# Patient Record
Sex: Male | Born: 1960
Health system: Southern US, Community
[De-identification: ages and names within clinical notes are randomized; demographics above are authoritative.]

---

## 2000-01-19 ENCOUNTER — Other Ambulatory Visit: Admission: RE | Admit: 2000-01-19 | Discharge: 2000-01-19 | Payer: Self-pay | Admitting: Urology

## 2000-07-24 ENCOUNTER — Encounter: Payer: Self-pay | Admitting: Specialist

## 2000-07-24 ENCOUNTER — Ambulatory Visit (HOSPITAL_COMMUNITY): Admission: RE | Admit: 2000-07-24 | Discharge: 2000-07-24 | Payer: Self-pay | Admitting: Specialist

## 2000-08-14 ENCOUNTER — Ambulatory Visit (HOSPITAL_COMMUNITY): Admission: RE | Admit: 2000-08-14 | Discharge: 2000-08-14 | Payer: Self-pay | Admitting: Specialist

## 2000-08-14 ENCOUNTER — Encounter: Payer: Self-pay | Admitting: Specialist

## 2000-08-15 ENCOUNTER — Encounter: Payer: Self-pay | Admitting: Specialist

## 2000-08-15 ENCOUNTER — Ambulatory Visit (HOSPITAL_COMMUNITY): Admission: RE | Admit: 2000-08-15 | Discharge: 2000-08-15 | Payer: Self-pay | Admitting: Specialist

## 2007-10-06 ENCOUNTER — Encounter: Admission: RE | Admit: 2007-10-06 | Discharge: 2007-10-06 | Payer: Self-pay | Admitting: Specialist

## 2011-02-21 ENCOUNTER — Emergency Department (HOSPITAL_COMMUNITY)
Admission: EM | Admit: 2011-02-21 | Discharge: 2011-02-21 | Disposition: A | Discharge status: Home / Self Care | Payer: Self-pay | Attending: Emergency Medicine | Admitting: Emergency Medicine

## 2011-02-21 DIAGNOSIS — F1010 Alcohol abuse, uncomplicated: Secondary | ICD-10-CM | POA: Insufficient documentation

## 2011-02-21 DIAGNOSIS — X58XXXA Exposure to other specified factors, initial encounter: Secondary | ICD-10-CM | POA: Insufficient documentation

## 2011-02-21 DIAGNOSIS — IMO0002 Reserved for concepts with insufficient information to code with codable children: Secondary | ICD-10-CM | POA: Insufficient documentation

## 2011-02-21 DIAGNOSIS — I10 Essential (primary) hypertension: Secondary | ICD-10-CM | POA: Insufficient documentation

## 2011-02-21 DIAGNOSIS — Z79899 Other long term (current) drug therapy: Secondary | ICD-10-CM | POA: Insufficient documentation

## 2011-02-21 LAB — POCT I-STAT, CHEM 8
BUN: 15 mg/dL (ref 6–23)
Calcium, Ion: 1.04 mmol/L — ABNORMAL LOW (ref 1.12–1.32)
Chloride: 105 mEq/L (ref 96–112)
Creatinine, Ser: 1.5 mg/dL — ABNORMAL HIGH (ref 0.50–1.35)
Glucose, Bld: 119 mg/dL — ABNORMAL HIGH (ref 70–99)
HCT: 43 % (ref 39.0–52.0)
Hemoglobin: 14.6 g/dL (ref 13.0–17.0)
Potassium: 3.9 mEq/L (ref 3.5–5.1)
Sodium: 142 mEq/L (ref 135–145)
TCO2: 24 mmol/L (ref 0–100)

## 2011-02-21 LAB — ETHANOL: Alcohol, Ethyl (B): 305 mg/dL — ABNORMAL HIGH (ref 0–11)

## 2011-02-21 LAB — RAPID URINE DRUG SCREEN, HOSP PERFORMED
Amphetamines: NOT DETECTED
Barbiturates: NOT DETECTED
Benzodiazepines: NOT DETECTED
Cocaine: NOT DETECTED
Opiates: NOT DETECTED
Tetrahydrocannabinol: NOT DETECTED

## 2015-10-19 MED FILL — LISINOPRIL 10 MG TABLET: 10 | 90 days supply | Qty: 90 | Fill #1

## 2015-10-19 MED FILL — PAROXETINE CR 25 MG TABLET: 25 | 90 days supply | Qty: 90 | Fill #3

## 2015-12-12 DIAGNOSIS — Z79899 Other long term (current) drug therapy: Secondary | ICD-10-CM | POA: Diagnosis not present

## 2015-12-12 DIAGNOSIS — J301 Allergic rhinitis due to pollen: Secondary | ICD-10-CM | POA: Diagnosis not present

## 2015-12-12 DIAGNOSIS — I10 Essential (primary) hypertension: Secondary | ICD-10-CM | POA: Diagnosis not present

## 2015-12-12 DIAGNOSIS — F325 Major depressive disorder, single episode, in full remission: Secondary | ICD-10-CM | POA: Diagnosis not present

## 2015-12-12 DIAGNOSIS — Z0000 Encounter for general adult medical examination without abnormal findings: Secondary | ICD-10-CM | POA: Diagnosis not present

## 2015-12-12 DIAGNOSIS — R110 Nausea: Secondary | ICD-10-CM | POA: Diagnosis not present

## 2015-12-12 DIAGNOSIS — F900 Attention-deficit hyperactivity disorder, predominantly inattentive type: Secondary | ICD-10-CM | POA: Diagnosis not present

## 2015-12-12 DIAGNOSIS — Z6833 Body mass index (BMI) 33.0-33.9, adult: Secondary | ICD-10-CM | POA: Diagnosis not present

## 2015-12-12 DIAGNOSIS — E669 Obesity, unspecified: Secondary | ICD-10-CM | POA: Diagnosis not present

## 2016-01-27 MED FILL — LISINOPRIL 10 MG TABLET: 10 | 90 days supply | Qty: 90 | Fill #2

## 2016-04-11 DIAGNOSIS — Z79899 Other long term (current) drug therapy: Secondary | ICD-10-CM | POA: Diagnosis not present

## 2016-04-11 DIAGNOSIS — Z6832 Body mass index (BMI) 32.0-32.9, adult: Secondary | ICD-10-CM | POA: Diagnosis not present

## 2016-04-11 DIAGNOSIS — K648 Other hemorrhoids: Secondary | ICD-10-CM | POA: Diagnosis not present

## 2016-04-11 DIAGNOSIS — E669 Obesity, unspecified: Secondary | ICD-10-CM | POA: Diagnosis not present

## 2016-04-11 DIAGNOSIS — I10 Essential (primary) hypertension: Secondary | ICD-10-CM | POA: Diagnosis not present

## 2016-04-11 DIAGNOSIS — F325 Major depressive disorder, single episode, in full remission: Secondary | ICD-10-CM | POA: Diagnosis not present

## 2016-05-11 MED FILL — LISINOPRIL 10 MG TABLET: 10 | 90 days supply | Qty: 90 | Fill #3

## 2016-08-17 DIAGNOSIS — H524 Presbyopia: Secondary | ICD-10-CM | POA: Diagnosis not present

## 2016-08-30 MED FILL — LISINOPRIL 10 MG TABLET: 10 | 90 days supply | Qty: 90 | Fill #0

## 2016-10-09 DIAGNOSIS — G8929 Other chronic pain: Secondary | ICD-10-CM | POA: Diagnosis not present

## 2016-10-09 DIAGNOSIS — I10 Essential (primary) hypertension: Secondary | ICD-10-CM | POA: Diagnosis not present

## 2016-10-09 DIAGNOSIS — M25511 Pain in right shoulder: Secondary | ICD-10-CM | POA: Diagnosis not present

## 2016-10-09 DIAGNOSIS — M25551 Pain in right hip: Secondary | ICD-10-CM | POA: Diagnosis not present

## 2016-10-09 DIAGNOSIS — Z79899 Other long term (current) drug therapy: Secondary | ICD-10-CM | POA: Diagnosis not present

## 2016-11-27 ENCOUNTER — Ambulatory Visit: Payer: Self-pay | Admitting: Sports Medicine

## 2016-12-10 MED FILL — LISINOPRIL 10 MG TABLET: 10 | 90 days supply | Qty: 90 | Fill #1

## 2017-01-01 ENCOUNTER — Ambulatory Visit (INDEPENDENT_AMBULATORY_CARE_PROVIDER_SITE_OTHER): Payer: 59 | Admitting: Sports Medicine

## 2017-01-01 ENCOUNTER — Ambulatory Visit: Payer: Self-pay

## 2017-01-01 VITALS — BP 164/92 | Ht 71.0 in | Wt 235.0 lb

## 2017-01-01 DIAGNOSIS — G8929 Other chronic pain: Secondary | ICD-10-CM

## 2017-01-01 DIAGNOSIS — M25511 Pain in right shoulder: Principal | ICD-10-CM

## 2017-01-01 DIAGNOSIS — M25551 Pain in right hip: Secondary | ICD-10-CM

## 2017-01-01 DIAGNOSIS — M25512 Pain in left shoulder: Secondary | ICD-10-CM

## 2017-01-01 MED ORDER — METHYLPREDNISOLONE ACETATE 40 MG/ML IJ SUSP
40.0000 mg | Freq: Once | INTRAMUSCULAR | Status: AC
  Administered 2017-01-01: 40 mg via INTRA_ARTICULAR

## 2017-01-01 NOTE — Assessment & Plan Note (Signed)
Possible for greater trochanteric bursitis versus gluteal medius syndrome. He has a leg length discrepancy so try biomechanical adjustments and see how he does with that. - A three-quarter length heel lift was placed in his left shoe - He may benefit from custom orthotics in the future. - Could consider a greater trochanteric injection in the future.

## 2017-01-01 NOTE — Assessment & Plan Note (Signed)
He had some bursal thickening in the right subacromial space but otherwise was strong through tender cuff testing and did not significantly have any tearing upon ultrasound scanning. He does have some degenerative changes of the acromioclavicular joint as well. - Subacromial injection today. - Provided with home exercises - If he follows up can consider nitroglycerin therapy

## 2017-01-01 NOTE — Progress Notes (Signed)
Christian Smith - 56 y.o. male MRN [id number]  Date of birth: 10-04-1960  SUBJECTIVE:  Including CC & ROS.   [name] is a 56 year old male is presenting with right hip pain and right shoulder pain. He reports that his right shoulder pain is lateral in nature. He reports the pain is acute on chronic has been worse over the last month. He has a history of acromioclavicular sprain as well as a fractured scapula of the same shoulder. He has taken over-the-counter NSAIDs for the pain. He describes pain as a dull aching usually after activity. The pain is proximal in nature. He has not had any injection therapy or physical therapy at this point.  Right hip pain is lateral in nature. The pain is worse after walking. It is relieved if he is able to sit down. He denies any radicular symptoms. He reports that he has a history of a leg length discrepancy but does not wear any corrective insoles or shoes.  ROS: No unexpected weight loss, fever, chills, swelling, instability,  numbness/tingling, redness, otherwise see HPI   HISTORY: Past Medical, Surgical, Social, and Family History Reviewed & Updated per EMR.   Pertinent Historical Findings include: PMHx -hypertension, T11-12 compression fracture Surgical: Bilateral bunionectomy, hemorrhoidectomy   PSHx -former smoker, former smokeless tobacco use   FHx -  diabetes, hypertension, CHF   DATA REVIEWED: None  PHYSICAL EXAM:  VS: BP (!) 164/92   Ht 5\' 11"  (1.803 m)   Wt 235 lb (106.6 kg)   BMI 32.78 kg/m  PHYSICAL EXAM: Gen: NAD, alert, cooperative with exam, well-appearing HEENT: clear conjunctiva, EOMI CV:  no edema, capillary refill brisk,  Resp: non-labored, normal speech Skin: no rashes, normal turgor  Neuro: no gross deficits.  Psych:  alert and oriented MSK:  Right Shoulder: His acromioclavicular joint as hypertrophy of the distal clavicle from prior before meals strain. Palpation is normal with no tenderness over AC joint ROM is  full in all planes. Some pain with empty can testing. Some pain with crossarm testing. Speeds and Yergason's tests normal. Right hip: No significant areas of tenderness over the lumbar spine, paraspinal muscles, piriformis, greater trochanter, or SI joint. Normal internal and external rotation of the hip bilaterally. Normal strength hip flexion. Normal knee flexion and extension. Some pain with FADIR  Normal FABER  Leg lengths from ASIS  Left: 92 cm  Right: 94.5 cm  Neurovascularly intact  Ultrasound: Right shoulder:  The biceps and was viewed in short axis and appears to be normal. The subscapularis and infraspinatus were normal in appearance. The supraspinatus had some bursal thickening and a mild hypoechoic changes at the endplate but does not appear to be a tear. He does have some calcific changes within the footplate of the supraspinatus. No chronic clavicular joint has some hypertrophy as well as tenderness changes in the acromioclavicular joint.   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  Ultrasound and interpretation by Clearance Coots, MD   Aspiration/Injection Procedure Note Christian Smith 02/21/1961  Procedure: Injection Indications: Right shoulder pain/subacromial bursitis  Procedure Details Consent: Risks of procedure as well as the alternatives and risks of each were explained to the (patient/caregiver).  Consent for procedure obtained. Time Out: Verified patient identification, verified procedure, site/side was marked, verified correct patient position, special equipment/implants available, medications/allergies/relevent history reviewed, required imaging and test results available.  Performed.  The area was cleaned with iodine and alcohol swabs.  The right subacromial space was injected using 1 cc's of 40 mg Depomedrol and 4 cc's of 1% lidocaine with a 22 1  1/2" needle.    A sterile dressing was applied.  Patient did tolerate procedure well.   ASSESSMENT & PLAN:   Chronic right shoulder pain He had some bursal thickening in the right subacromial space but otherwise was strong through tender cuff testing and did not significantly have any tearing upon ultrasound scanning. He does have some degenerative changes of the acromioclavicular joint as well. - Subacromial injection today. - Provided with home exercises - If he follows up can consider nitroglycerin therapy  Right hip pain Possible for greater trochanteric bursitis versus gluteal medius syndrome. He has a leg length discrepancy so try biomechanical adjustments and see how he does with that. - A three-quarter length heel lift was placed in his left shoe - He may benefit from custom orthotics in the future. - Could consider a greater trochanteric injection in the future.

## 2017-01-04 DIAGNOSIS — R351 Nocturia: Secondary | ICD-10-CM | POA: Diagnosis not present

## 2017-01-04 DIAGNOSIS — F325 Major depressive disorder, single episode, in full remission: Secondary | ICD-10-CM | POA: Diagnosis not present

## 2017-01-04 DIAGNOSIS — Z79899 Other long term (current) drug therapy: Secondary | ICD-10-CM | POA: Diagnosis not present

## 2017-01-04 DIAGNOSIS — Z125 Encounter for screening for malignant neoplasm of prostate: Secondary | ICD-10-CM | POA: Diagnosis not present

## 2017-01-04 DIAGNOSIS — N401 Enlarged prostate with lower urinary tract symptoms: Secondary | ICD-10-CM | POA: Diagnosis not present

## 2017-01-04 DIAGNOSIS — E669 Obesity, unspecified: Secondary | ICD-10-CM | POA: Diagnosis not present

## 2017-01-04 DIAGNOSIS — I10 Essential (primary) hypertension: Secondary | ICD-10-CM | POA: Diagnosis not present

## 2017-01-04 DIAGNOSIS — Z0000 Encounter for general adult medical examination without abnormal findings: Secondary | ICD-10-CM | POA: Diagnosis not present

## 2017-01-04 DIAGNOSIS — R197 Diarrhea, unspecified: Secondary | ICD-10-CM | POA: Diagnosis not present

## 2017-01-04 MED FILL — AMLODIPINE BESYLATE 2.5 MG: 2.5 | 30 days supply | Qty: 30 | Fill #0

## 2017-02-05 MED FILL — AMLODIPINE BESYLATE 2.5 MG: 2.5 | 30 days supply | Qty: 30 | Fill #1

## 2017-03-11 DIAGNOSIS — I10 Essential (primary) hypertension: Secondary | ICD-10-CM | POA: Diagnosis not present

## 2017-03-11 MED FILL — AMLODIPINE BESYLATE 2.5 MG: 2.5 | 30 days supply | Qty: 30 | Fill #2

## 2017-03-27 MED FILL — LISINOPRIL 10 MG TABLET: 10 | 90 days supply | Qty: 90 | Fill #2

## 2017-04-16 MED FILL — AMLODIPINE BESYLATE 2.5 MG: 2.5 | 30 days supply | Qty: 30 | Fill #3

## 2017-05-17 MED FILL — AMLODIPINE BESYLATE 2.5 MG: 2.5 | 30 days supply | Qty: 30 | Fill #4

## 2017-06-20 MED FILL — AMLODIPINE BESYLATE 2.5 MG: 2.5 | 30 days supply | Qty: 30 | Fill #5

## 2017-06-21 DIAGNOSIS — M25572 Pain in left ankle and joints of left foot: Secondary | ICD-10-CM | POA: Diagnosis not present

## 2017-06-21 DIAGNOSIS — M76822 Posterior tibial tendinitis, left leg: Secondary | ICD-10-CM | POA: Diagnosis not present

## 2017-06-27 MED FILL — LISINOPRIL 10 MG TABS: 10 | 90 days supply | Qty: 90 | Fill #3

## 2017-07-08 DIAGNOSIS — K591 Functional diarrhea: Secondary | ICD-10-CM | POA: Diagnosis not present

## 2017-07-08 DIAGNOSIS — R198 Other specified symptoms and signs involving the digestive system and abdomen: Secondary | ICD-10-CM | POA: Diagnosis not present

## 2017-07-08 DIAGNOSIS — M76822 Posterior tibial tendinitis, left leg: Secondary | ICD-10-CM | POA: Diagnosis not present

## 2017-07-15 DIAGNOSIS — Z23 Encounter for immunization: Secondary | ICD-10-CM | POA: Diagnosis not present

## 2017-07-15 DIAGNOSIS — I10 Essential (primary) hypertension: Secondary | ICD-10-CM | POA: Diagnosis not present

## 2017-07-16 MED FILL — AMLODIPINE BESYLATE 2.5 MG: 2.5 | 90 days supply | Qty: 90 | Fill #0

## 2017-08-02 DIAGNOSIS — M76822 Posterior tibial tendinitis, left leg: Secondary | ICD-10-CM | POA: Diagnosis not present

## 2017-08-02 MED FILL — DICLOFENAC SODIUM 1% GEL: 1 | 30 days supply | Qty: 500 | Fill #0

## 2017-10-02 DIAGNOSIS — H524 Presbyopia: Secondary | ICD-10-CM | POA: Diagnosis not present

## 2017-10-10 MED FILL — AMLODIPINE BESYLATE 2.5 MG: 2.5 | 90 days supply | Qty: 90 | Fill #1

## 2017-10-10 MED FILL — LISINOPRIL 10 MG TABS: 10 | 90 days supply | Qty: 90 | Fill #0

## 2018-01-07 ENCOUNTER — Other Ambulatory Visit: Payer: Self-pay | Admitting: Geriatric Medicine

## 2018-01-07 DIAGNOSIS — F325 Major depressive disorder, single episode, in full remission: Secondary | ICD-10-CM | POA: Diagnosis not present

## 2018-01-07 DIAGNOSIS — I10 Essential (primary) hypertension: Secondary | ICD-10-CM | POA: Diagnosis not present

## 2018-01-07 DIAGNOSIS — Z87891 Personal history of nicotine dependence: Secondary | ICD-10-CM

## 2018-01-07 DIAGNOSIS — F900 Attention-deficit hyperactivity disorder, predominantly inattentive type: Secondary | ICD-10-CM | POA: Diagnosis not present

## 2018-01-07 DIAGNOSIS — Z79899 Other long term (current) drug therapy: Secondary | ICD-10-CM | POA: Diagnosis not present

## 2018-01-07 DIAGNOSIS — E669 Obesity, unspecified: Secondary | ICD-10-CM | POA: Diagnosis not present

## 2018-01-07 DIAGNOSIS — Z0000 Encounter for general adult medical examination without abnormal findings: Secondary | ICD-10-CM | POA: Diagnosis not present

## 2018-01-07 DIAGNOSIS — Z6831 Body mass index (BMI) 31.0-31.9, adult: Secondary | ICD-10-CM | POA: Diagnosis not present

## 2018-01-13 ENCOUNTER — Ambulatory Visit
Admission: RE | Admit: 2018-01-13 | Discharge: 2018-01-13 | Disposition: A | Discharge status: Home / Self Care | Payer: 59 | Source: Ambulatory Visit | Attending: Geriatric Medicine | Admitting: Geriatric Medicine

## 2018-01-13 DIAGNOSIS — Z87891 Personal history of nicotine dependence: Secondary | ICD-10-CM

## 2018-01-13 DIAGNOSIS — I714 Abdominal aortic aneurysm, without rupture: Secondary | ICD-10-CM | POA: Diagnosis not present

## 2018-01-15 MED FILL — AMLODIPINE BESYLATE 2.5 MG: 2.5 | 90 days supply | Qty: 90 | Fill #2

## 2018-01-15 MED FILL — LISINOPRIL 10 MG TABLET: 10 | 90 days supply | Qty: 90 | Fill #1

## 2018-04-21 MED FILL — LISINOPRIL 10 MG TABLET: 10 | 90 days supply | Qty: 90 | Fill #2

## 2018-04-21 MED FILL — AMLODIPINE 2.5 MG TABLET: 2.5 | 90 days supply | Qty: 90 | Fill #0

## 2018-06-30 DIAGNOSIS — M25511 Pain in right shoulder: Secondary | ICD-10-CM | POA: Diagnosis not present

## 2018-06-30 DIAGNOSIS — I10 Essential (primary) hypertension: Secondary | ICD-10-CM | POA: Diagnosis not present

## 2018-06-30 DIAGNOSIS — Z79899 Other long term (current) drug therapy: Secondary | ICD-10-CM | POA: Diagnosis not present

## 2018-06-30 DIAGNOSIS — L989 Disorder of the skin and subcutaneous tissue, unspecified: Secondary | ICD-10-CM | POA: Diagnosis not present

## 2018-06-30 DIAGNOSIS — E669 Obesity, unspecified: Secondary | ICD-10-CM | POA: Diagnosis not present

## 2018-06-30 DIAGNOSIS — Z23 Encounter for immunization: Secondary | ICD-10-CM | POA: Diagnosis not present

## 2018-06-30 MED FILL — AMLODIPINE BESYLATE 5 MG TA: 5 | 30 days supply | Qty: 30 | Fill #0

## 2018-07-17 MED FILL — LISINOPRIL 10 MG TABS: 10 | 90 days supply | Qty: 90 | Fill #3

## 2018-08-11 MED FILL — AMLODIPINE BESYLATE 5 MG TA: 5 | 30 days supply | Qty: 30 | Fill #1

## 2018-09-11 MED FILL — AMLODIPINE BESYLATE 5 MG TA: 5 | 30 days supply | Qty: 30 | Fill #2

## 2018-10-09 MED FILL — AMLODIPINE BESYLATE 5 MG TA: 5 | 30 days supply | Qty: 30 | Fill #3

## 2018-10-24 DIAGNOSIS — H524 Presbyopia: Secondary | ICD-10-CM | POA: Diagnosis not present

## 2018-10-24 MED FILL — LISINOPRIL 10 MG TABS: 10 | 90 days supply | Qty: 90 | Fill #0

## 2018-10-30 ENCOUNTER — Ambulatory Visit: Payer: 59 | Admitting: Sports Medicine

## 2018-10-30 ENCOUNTER — Encounter: Payer: Self-pay | Admitting: Sports Medicine

## 2018-10-30 VITALS — BP 124/64 | Ht 71.0 in | Wt 244.0 lb

## 2018-10-30 DIAGNOSIS — M25512 Pain in left shoulder: Secondary | ICD-10-CM | POA: Diagnosis not present

## 2018-10-30 DIAGNOSIS — M25511 Pain in right shoulder: Secondary | ICD-10-CM

## 2018-10-30 NOTE — Assessment & Plan Note (Signed)
WE will assess status of XRays bilaterally  Keep up ROM  PRN aleve  Careful with overhead lifting

## 2018-10-30 NOTE — Progress Notes (Addendum)
  Christian Smith - 58 y.o. male MRN [id number]  Date of birth: 19-Jan-1961 Chief Complaint: Bilateral shoulder pain  HPI:  58 year old male who presents for bilateral shoulder pain (R>L).   Right shoulder Patient seen in 12/2016 which revealed bursal thickening and right subacromial space.  Also had findings consistent with AC joint arthritis.  Patient received cortisone injection which did help his pain for a few months.  He states that the pain is slowly coming back, and feels very similar to how it did before hand in 2018.  He has tried naproxen and topical Voltaren gel which have helped the symptoms some.  For his job he frequently has to lift 30 pounds up and down because this might be the etiology is of his pain.  Left shoulder States that he started having left shoulder pain early after the visit and 12/2016.  The left arm is worse than the right.  Causes significant pain at night and with activity.  States that he has trouble lifting things off high shelves due to the pain.  Treatment and response similar to right arm.  Past Hx Was in [place] Has done weight lifting and physical work   ROS:     See HPI  PERTINENT  PMH / [place] FH / / SH:  Past Medical, Surgical, Social, and Family History Reviewed & Updated in the EMR.  Pertinent findings include:  Right hip pain  OBJECTIVE: BP 124/64   Ht 5\' 11"  (1.803 m)   Wt 244 lb (110.7 kg)   BMI 34.03 kg/m   Physical Exam:  Vital signs are reviewed.  GEN: Alert and oriented, NAD Pulm: Breathing unlabored PSY: normal mood, congruent affect  MSK: Right Shoulder Inspection: no asymmetry noted Palpation: Mild tenderness to deep palpation medial aspect right shoulder Range of motion: full range of motion intact to shoulder extension, flexion, internal rotation, external rotation, adduction, abduction Strength: 5/5 strength to shoulder extensors, flexors. 5/5 strength to internal and external rotation. Neurovascular: no focal deficits. Cap  refill < 2 seconds. No numbness or tingling in arms Special tests: negative neer's, hawkins, empty can  Left Shoulder Inspection: no asymmetry noted Palpation: moderate tenderness to deep palpation medial/posterior aspect right shoulder Range of motion: limited range of motion to shoulder flexion, extension to 140 degrees. Limited range of motion to shoulder adduction, abduction - limits seem to be 2/2 pain Strength: 5/5 strength to shoulder extensors, flexors. 5/5 strength to internal and external rotation. Neurovascular: no focal deficits. Cap refill < 2 seconds. No numbness or tingling in arms Special tests: negative neer's, hawkins, empty can  ASSESSMENT & PLAN:  1. Bilateral Shoulder pain Patient without obvious signs of rotator cuff pathology. Likely further progression of osteoarthritis. Will get bilateral shoulder xrays to further evaluate. In the meantime can continue with anti-inflammatory medications. If negative for OA will have patient return to clinic for formal ultrasound to evaluate cuff and shoulder joints.  Keep up Rom exercises  Guadalupe Dawn MD PGY-2 Family Medicine Resident  I observed and examined the patient with the resident and agree with assessment and plan.  Note reviewed and modified by me. Ila Mcgill, MD  I reviewed XRs and called the patient He has mild OA of AC joints bilaterally If symptoms persist we should bring back for [place] of shoulders.  Left message for patient.  Ila Mcgill, MD 11/21/18

## 2018-11-05 ENCOUNTER — Ambulatory Visit
Admission: RE | Admit: 2018-11-05 | Discharge: 2018-11-05 | Disposition: A | Discharge status: Home / Self Care | Payer: 59 | Source: Ambulatory Visit | Attending: Sports Medicine | Admitting: Sports Medicine

## 2018-11-05 ENCOUNTER — Other Ambulatory Visit: Payer: Self-pay

## 2018-11-05 DIAGNOSIS — M25511 Pain in right shoulder: Secondary | ICD-10-CM

## 2018-11-05 DIAGNOSIS — M19011 Primary osteoarthritis, right shoulder: Secondary | ICD-10-CM | POA: Diagnosis not present

## 2018-11-05 DIAGNOSIS — M19012 Primary osteoarthritis, left shoulder: Secondary | ICD-10-CM | POA: Diagnosis not present

## 2018-11-05 DIAGNOSIS — M25512 Pain in left shoulder: Principal | ICD-10-CM

## 2018-11-05 MED FILL — AMLODIPINE BESYLATE 5 MG TA: 5 | 30 days supply | Qty: 30 | Fill #4 | Status: TO

## 2018-12-08 MED FILL — AMLODIPINE BESYLATE 5 MG TA: 5 | 90 days supply | Qty: 90 | Fill #0

## 2018-12-17 DIAGNOSIS — C44222 Squamous cell carcinoma of skin of right ear and external auricular canal: Secondary | ICD-10-CM | POA: Diagnosis not present

## 2018-12-18 DIAGNOSIS — C44222 Squamous cell carcinoma of skin of right ear and external auricular canal: Secondary | ICD-10-CM | POA: Diagnosis not present

## 2019-01-20 MED FILL — LISINOPRIL 10 MG TABS: 10 | 90 days supply | Qty: 90 | Fill #1

## 2019-03-16 MED FILL — AMLODIPINE BESYLATE 5 MG TA: 5 | 60 days supply | Qty: 60 | Fill #1

## 2019-04-29 DIAGNOSIS — D2371 Other benign neoplasm of skin of right lower limb, including hip: Secondary | ICD-10-CM | POA: Diagnosis not present

## 2019-04-29 DIAGNOSIS — L814 Other melanin hyperpigmentation: Secondary | ICD-10-CM | POA: Diagnosis not present

## 2019-04-29 DIAGNOSIS — Z85828 Personal history of other malignant neoplasm of skin: Secondary | ICD-10-CM | POA: Diagnosis not present

## 2019-04-29 DIAGNOSIS — L570 Actinic keratosis: Secondary | ICD-10-CM | POA: Diagnosis not present

## 2019-04-29 MED FILL — FLUOROURACIL 5% CREAM: 5 | 14 days supply | Qty: 40 | Fill #0

## 2019-05-07 MED FILL — LISINOPRIL 10 MG TABS: 10 | 90 days supply | Qty: 90 | Fill #2

## 2019-05-18 MED FILL — FLUOROURACIL 5% CREAM: 5 | 14 days supply | Qty: 40 | Fill #0

## 2019-05-20 MED FILL — AMLODIPINE BESYLATE 5 MG TA: 5 | 30 days supply | Qty: 30 | Fill #0

## 2019-06-24 MED FILL — AMLODIPINE BESYLATE 5 MG TA: 5 | 30 days supply | Qty: 30 | Fill #1

## 2019-07-01 DIAGNOSIS — I10 Essential (primary) hypertension: Secondary | ICD-10-CM | POA: Diagnosis not present

## 2019-07-01 DIAGNOSIS — Z79899 Other long term (current) drug therapy: Secondary | ICD-10-CM | POA: Diagnosis not present

## 2019-07-01 DIAGNOSIS — Z125 Encounter for screening for malignant neoplasm of prostate: Secondary | ICD-10-CM | POA: Diagnosis not present

## 2019-07-01 DIAGNOSIS — Z23 Encounter for immunization: Secondary | ICD-10-CM | POA: Diagnosis not present

## 2019-07-01 DIAGNOSIS — Z6835 Body mass index (BMI) 35.0-35.9, adult: Secondary | ICD-10-CM | POA: Diagnosis not present

## 2019-07-01 DIAGNOSIS — Z0000 Encounter for general adult medical examination without abnormal findings: Secondary | ICD-10-CM | POA: Diagnosis not present

## 2019-07-30 MED FILL — AMLODIPINE BESYLATE 5 MG TA: 5 | 30 days supply | Qty: 30 | Fill #2

## 2019-08-31 MED FILL — AMLODIPINE BESYLATE 5 MG TA: 5 | 30 days supply | Qty: 30 | Fill #3

## 2019-09-14 MED FILL — LISINOPRIL 10 MG TABS: 10 | 90 days supply | Qty: 90 | Fill #0

## 2019-10-06 MED FILL — AMLODIPINE BESYLATE 5 MG TA: 5 | 30 days supply | Qty: 30 | Fill #4

## 2019-11-05 MED FILL — AMLODIPINE BESYLATE 5 MG TA: 5 | 30 days supply | Qty: 30 | Fill #5

## 2019-11-25 MED FILL — AMLODIPINE BESYLATE 5 MG TA: 5 | 30 days supply | Qty: 30 | Fill #6

## 2019-12-24 MED FILL — LISINOPRIL 10 MG TABS: 10 | 90 days supply | Qty: 90 | Fill #1

## 2020-01-15 MED FILL — AMLODIPINE BESYLATE 5 MG TA: 5 | 30 days supply | Qty: 30 | Fill #7

## 2020-01-26 IMAGING — DX RIGHT SHOULDER - 2+ VIEW
3 series · 3 of 3 positions shown · non-contrast
Comparison: None.

CLINICAL DATA: Bilateral shoulder pain over the last year. Lifting
injury 1 year ago.

EXAM:
RIGHT SHOULDER - 2+ VIEW

[dg shoulder right (1 of 3)]
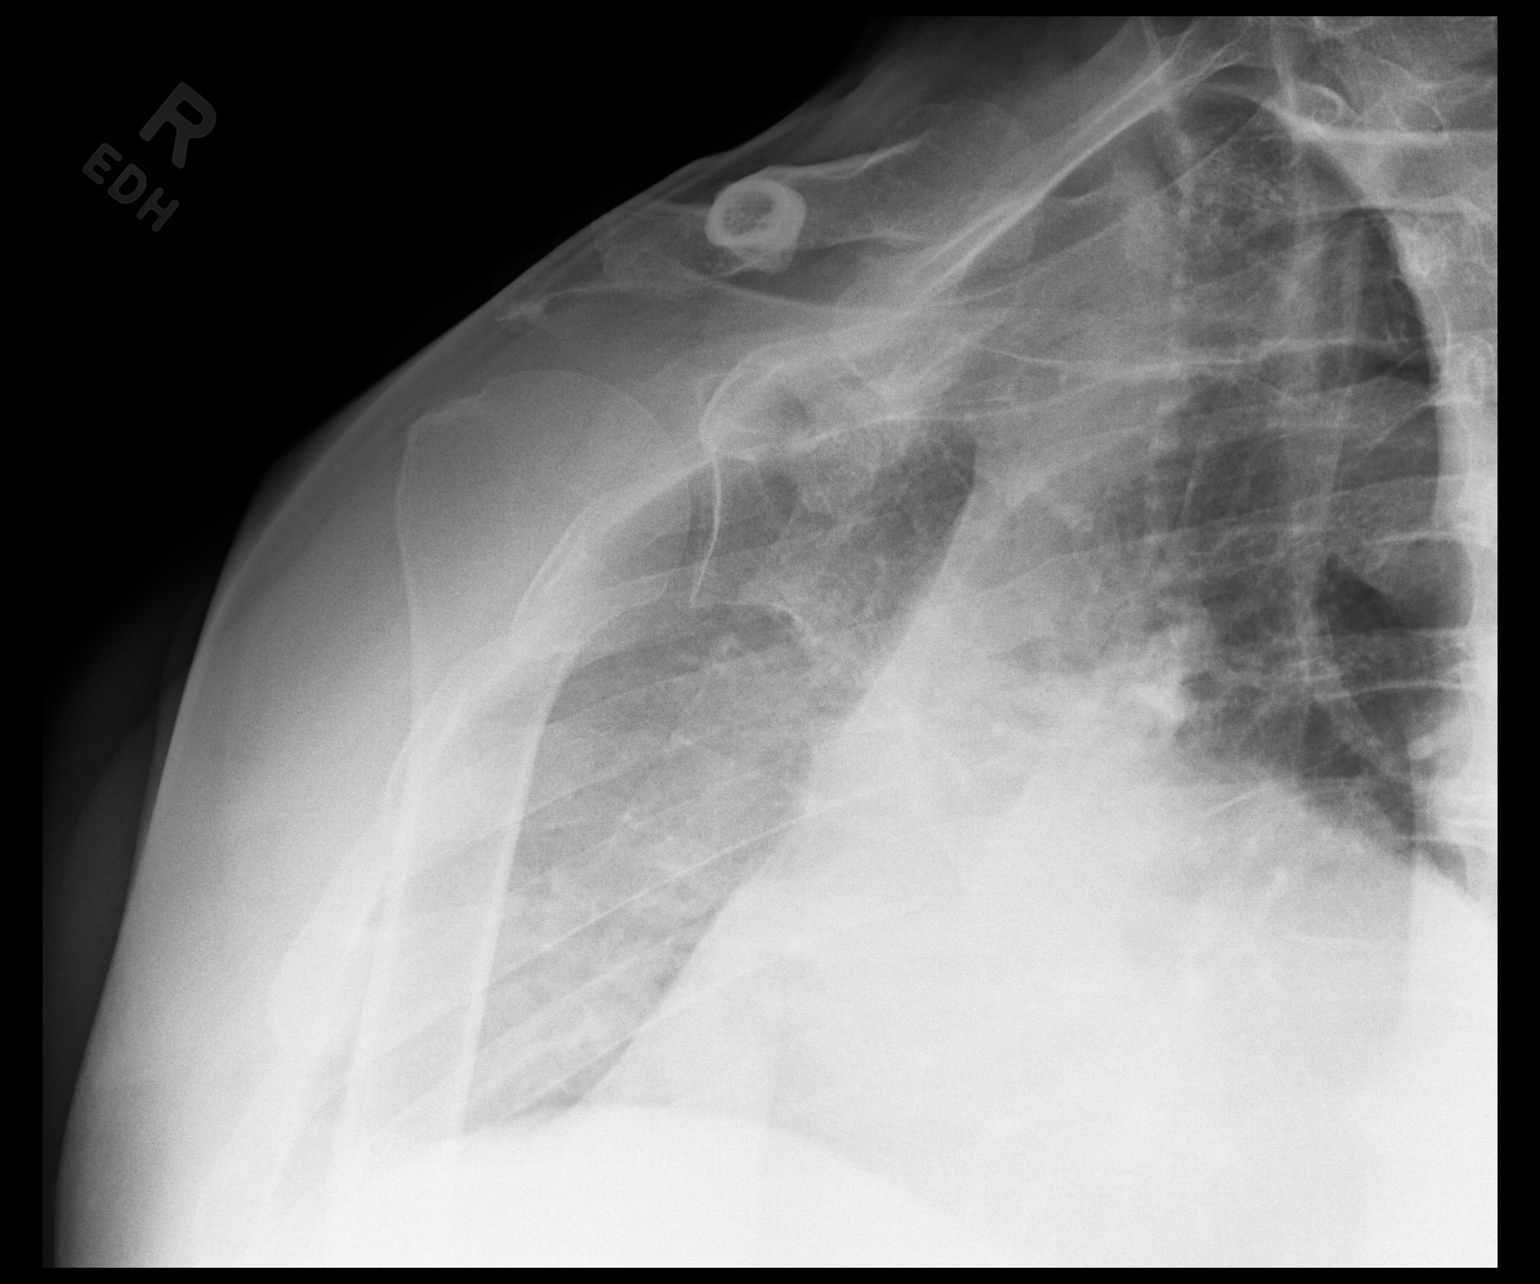

[dg shoulder right (2 of 3)]
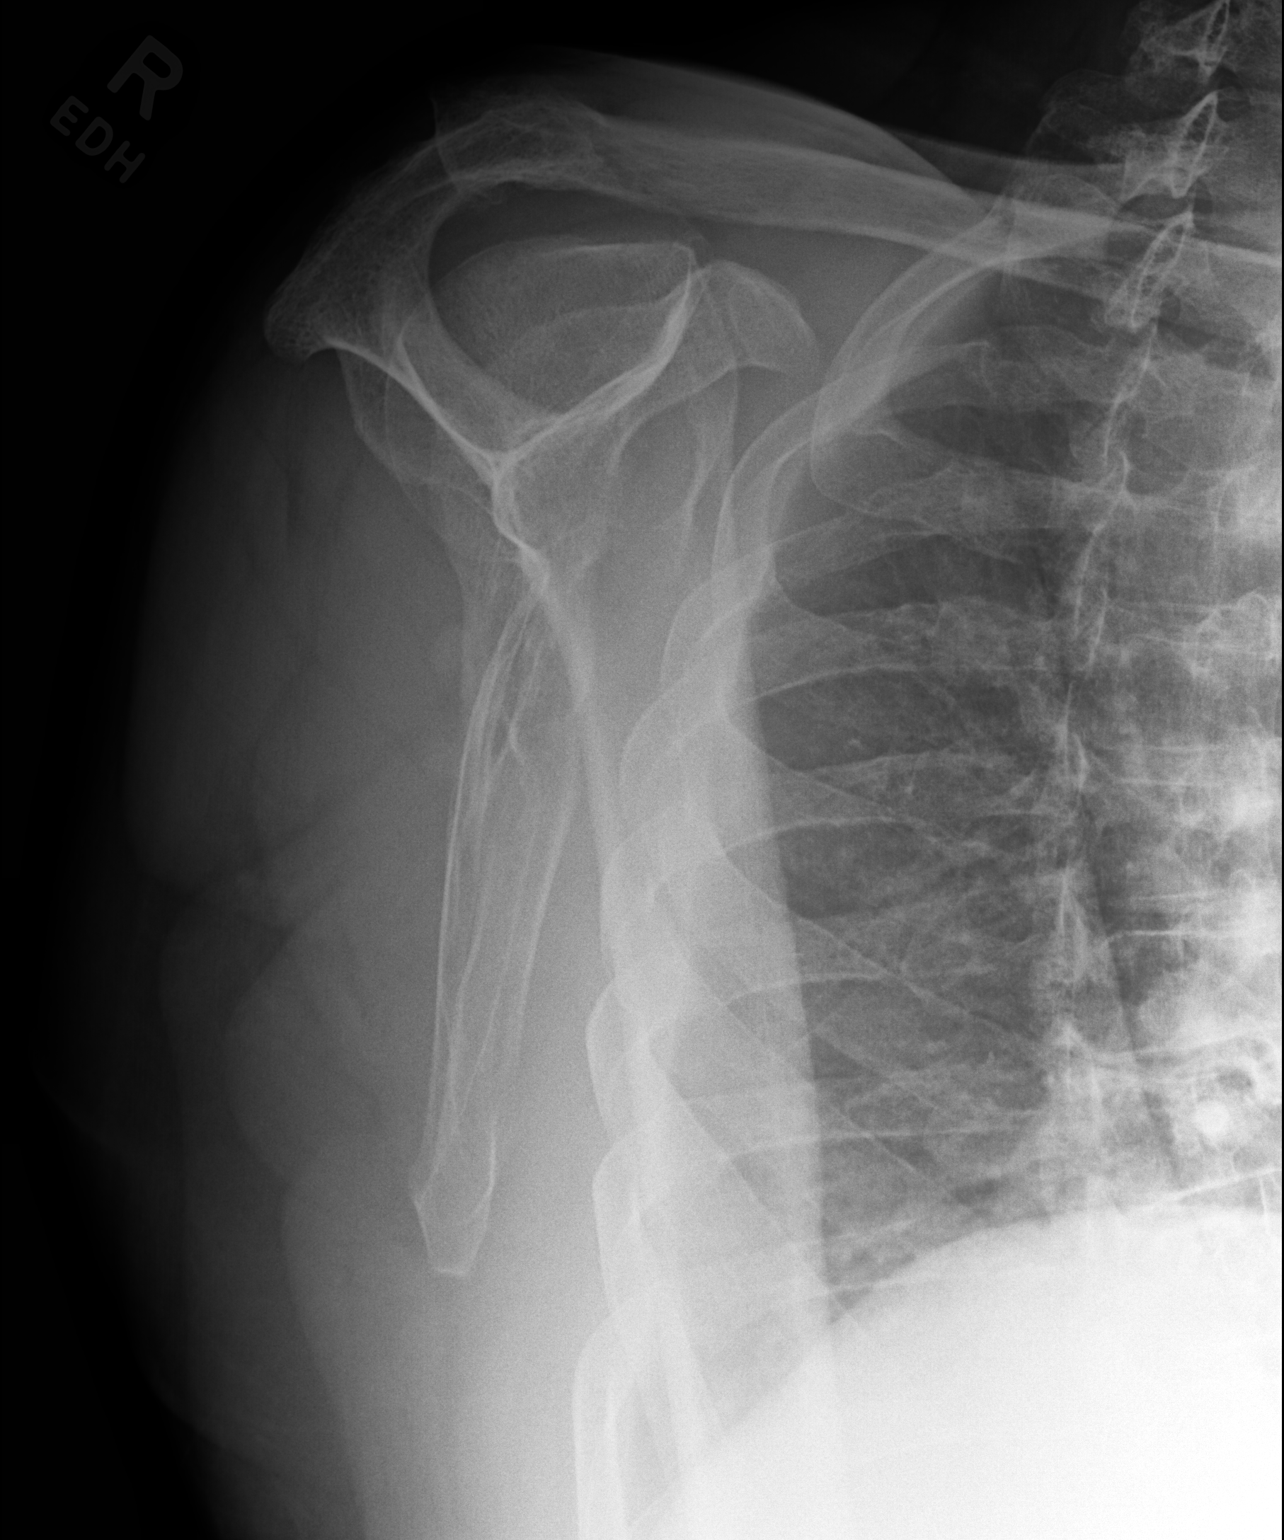

[dg shoulder right (3 of 3)]
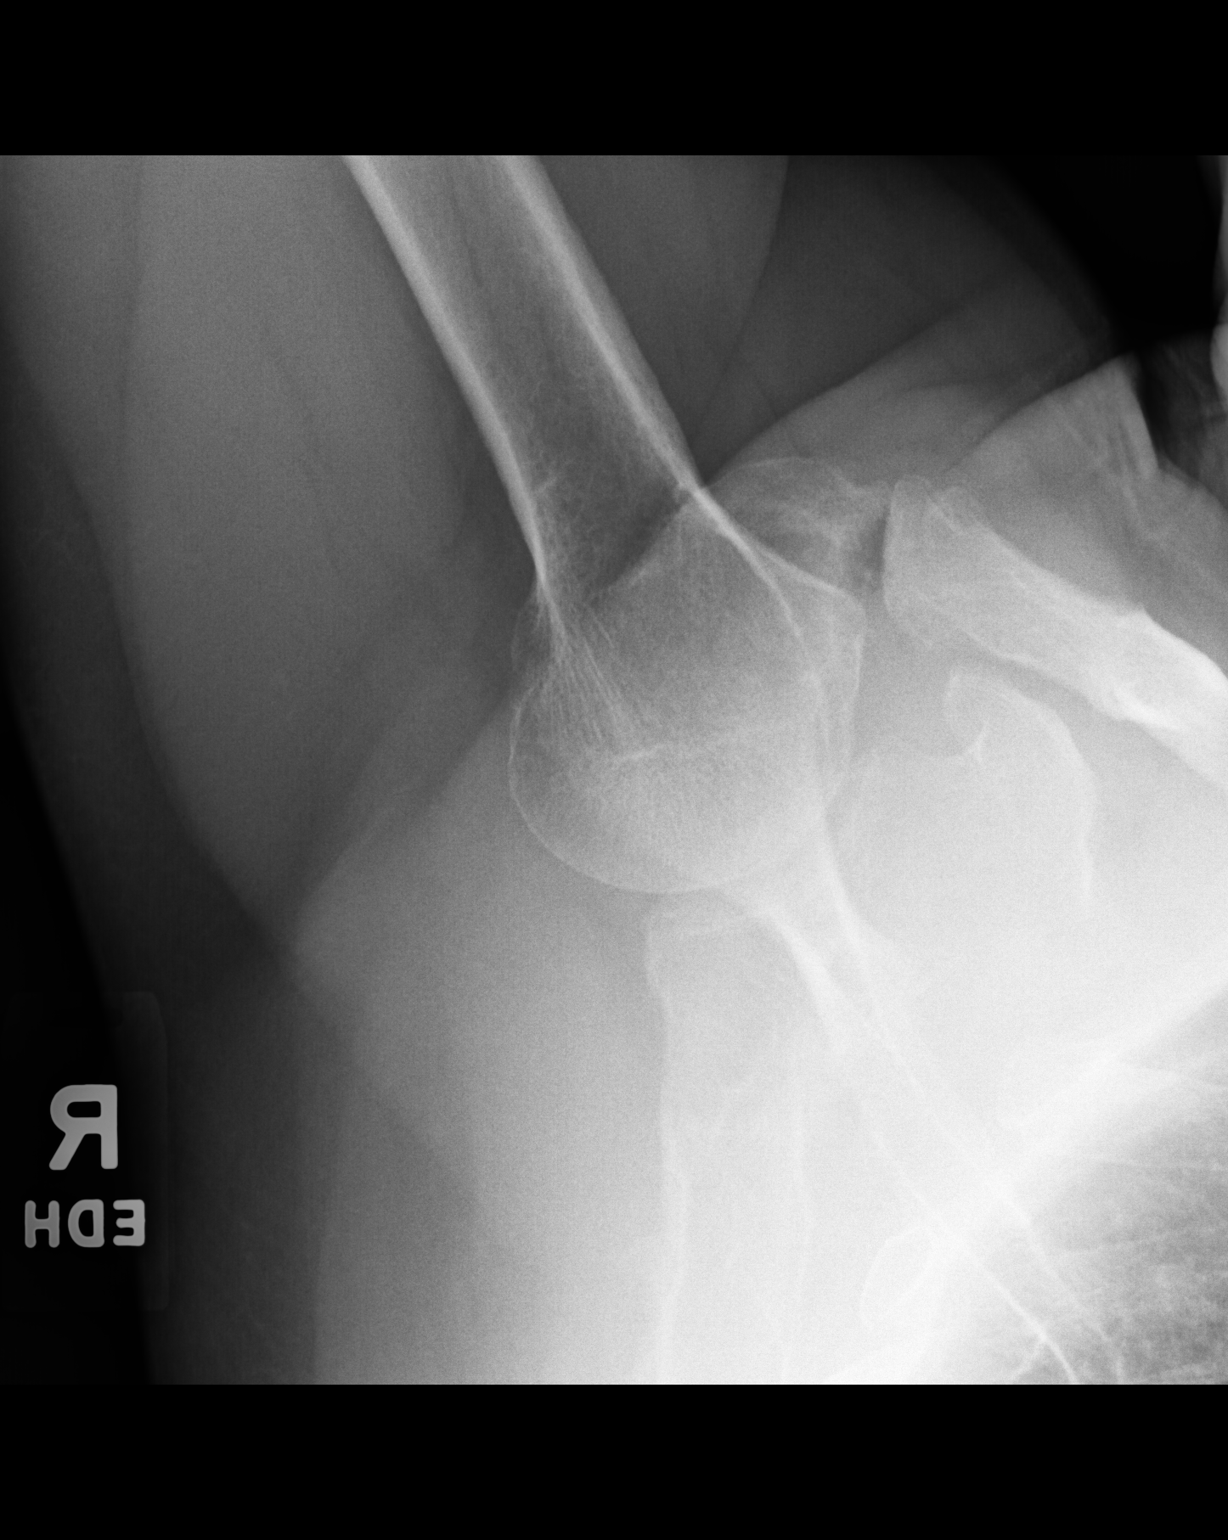

[3 of 3 positions shown; findings below may reference images not displayed]

FINDINGS: Glenohumeral joint appears normal. There are degenerative changes of
the AC joint. Other regional bone structures appear normal.
IMPRESSION: AC joint degenerative change.

## 2020-01-26 IMAGING — DX LEFT SHOULDER - 2+ VIEW
3 series · 3 of 3 positions shown · non-contrast
Comparison: None.

CLINICAL DATA: Lifting injury 1 year ago.  Pain.

EXAM:
LEFT SHOULDER - 2+ VIEW

[dg shoulder left (1 of 3)]
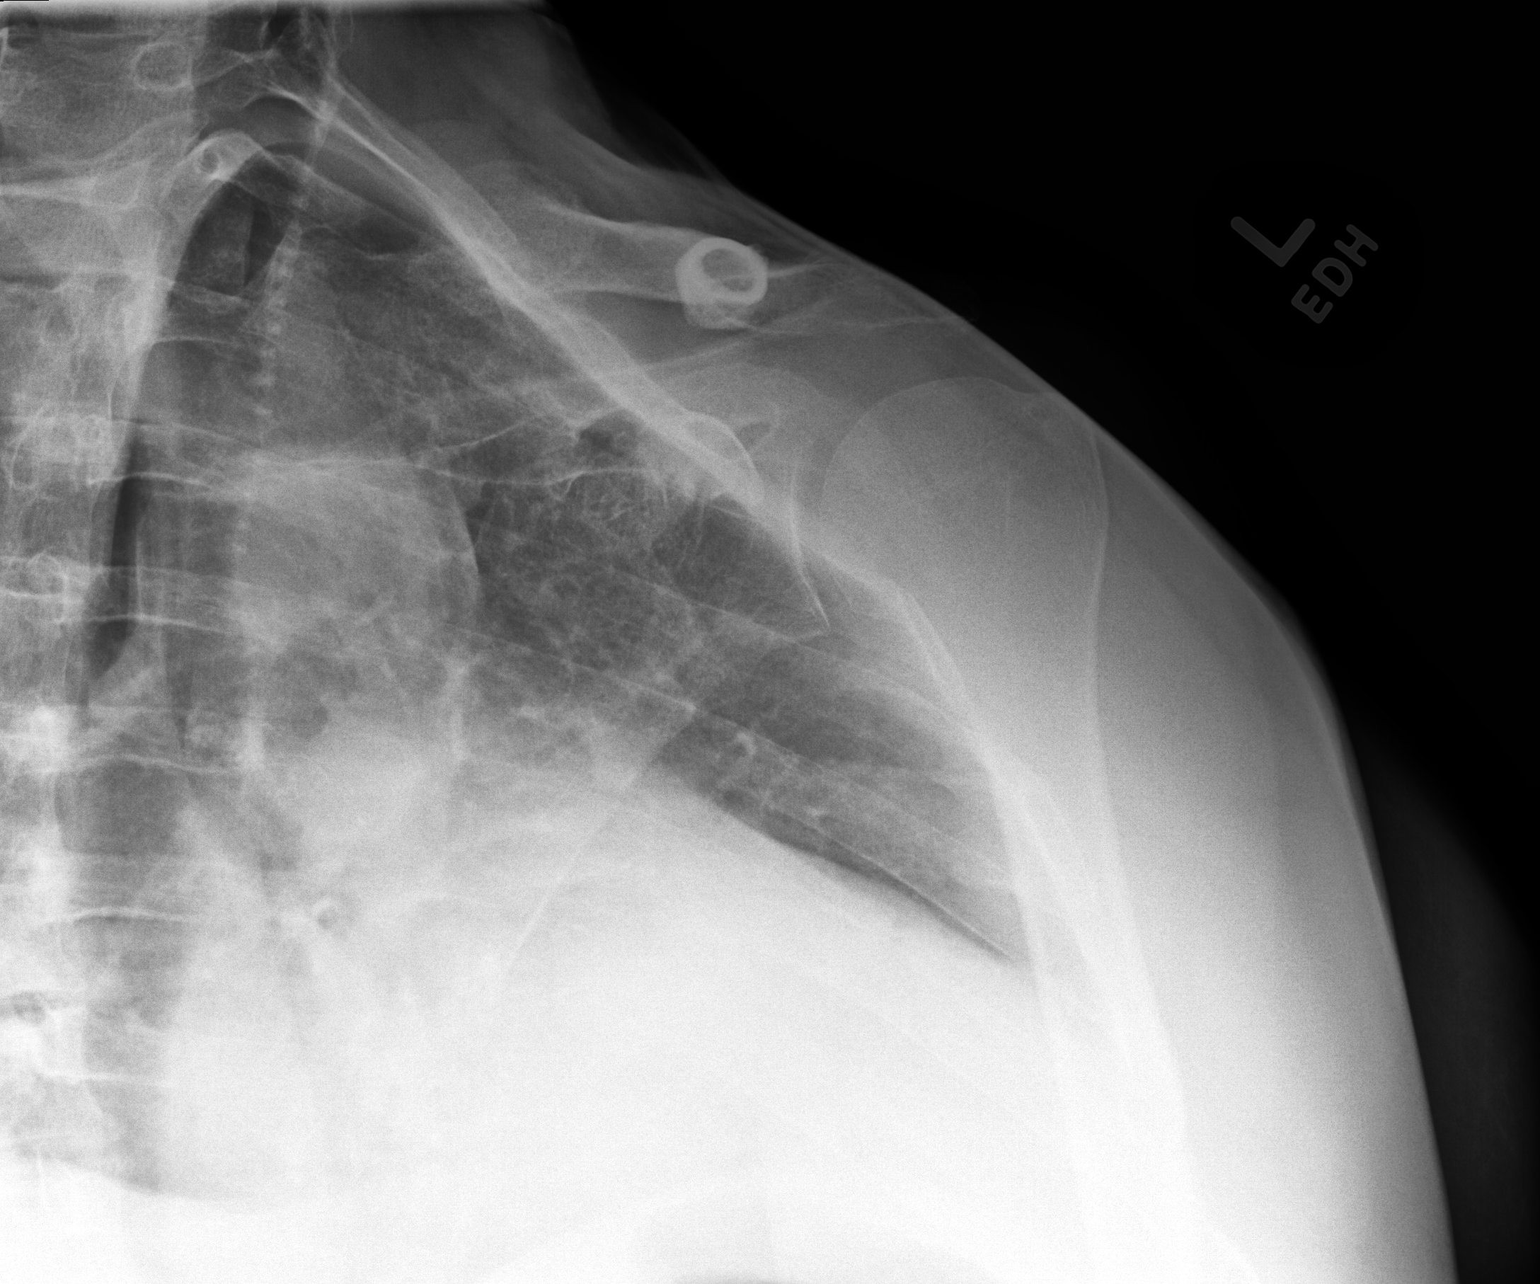

[dg shoulder left (2 of 3)]
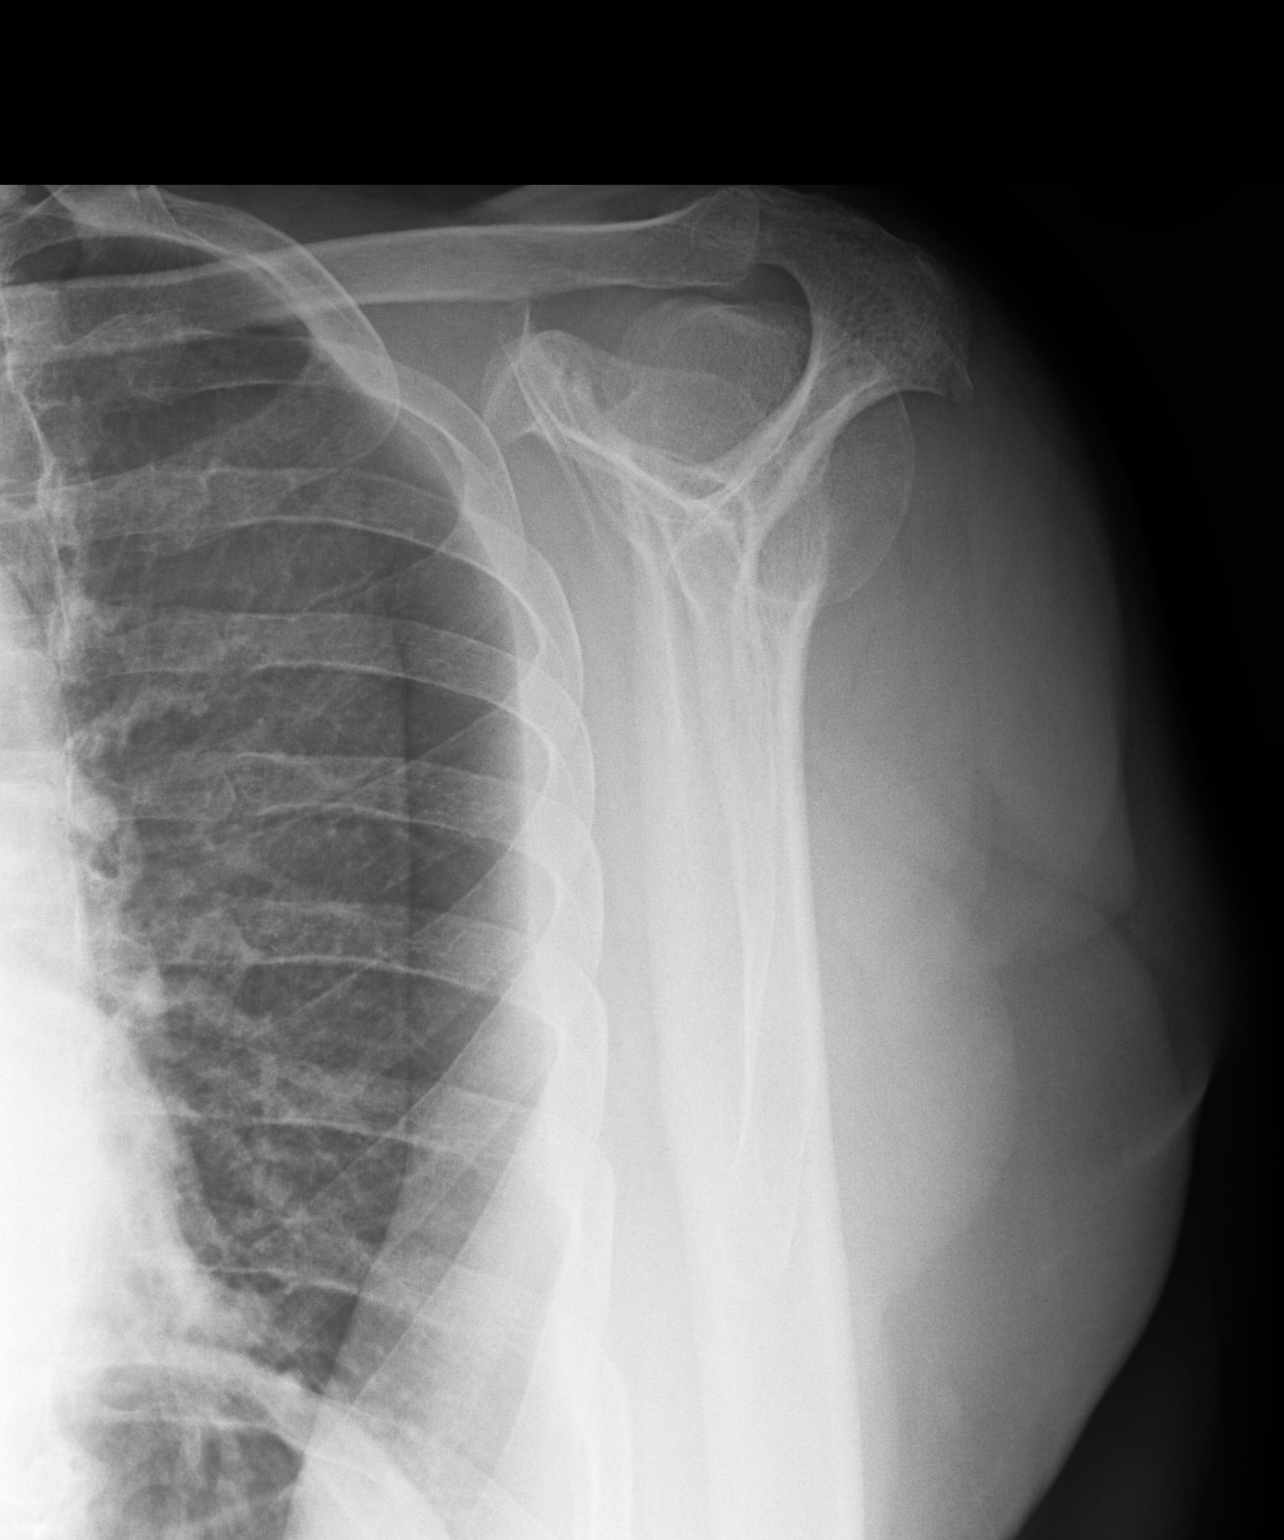

[dg shoulder left (3 of 3)]
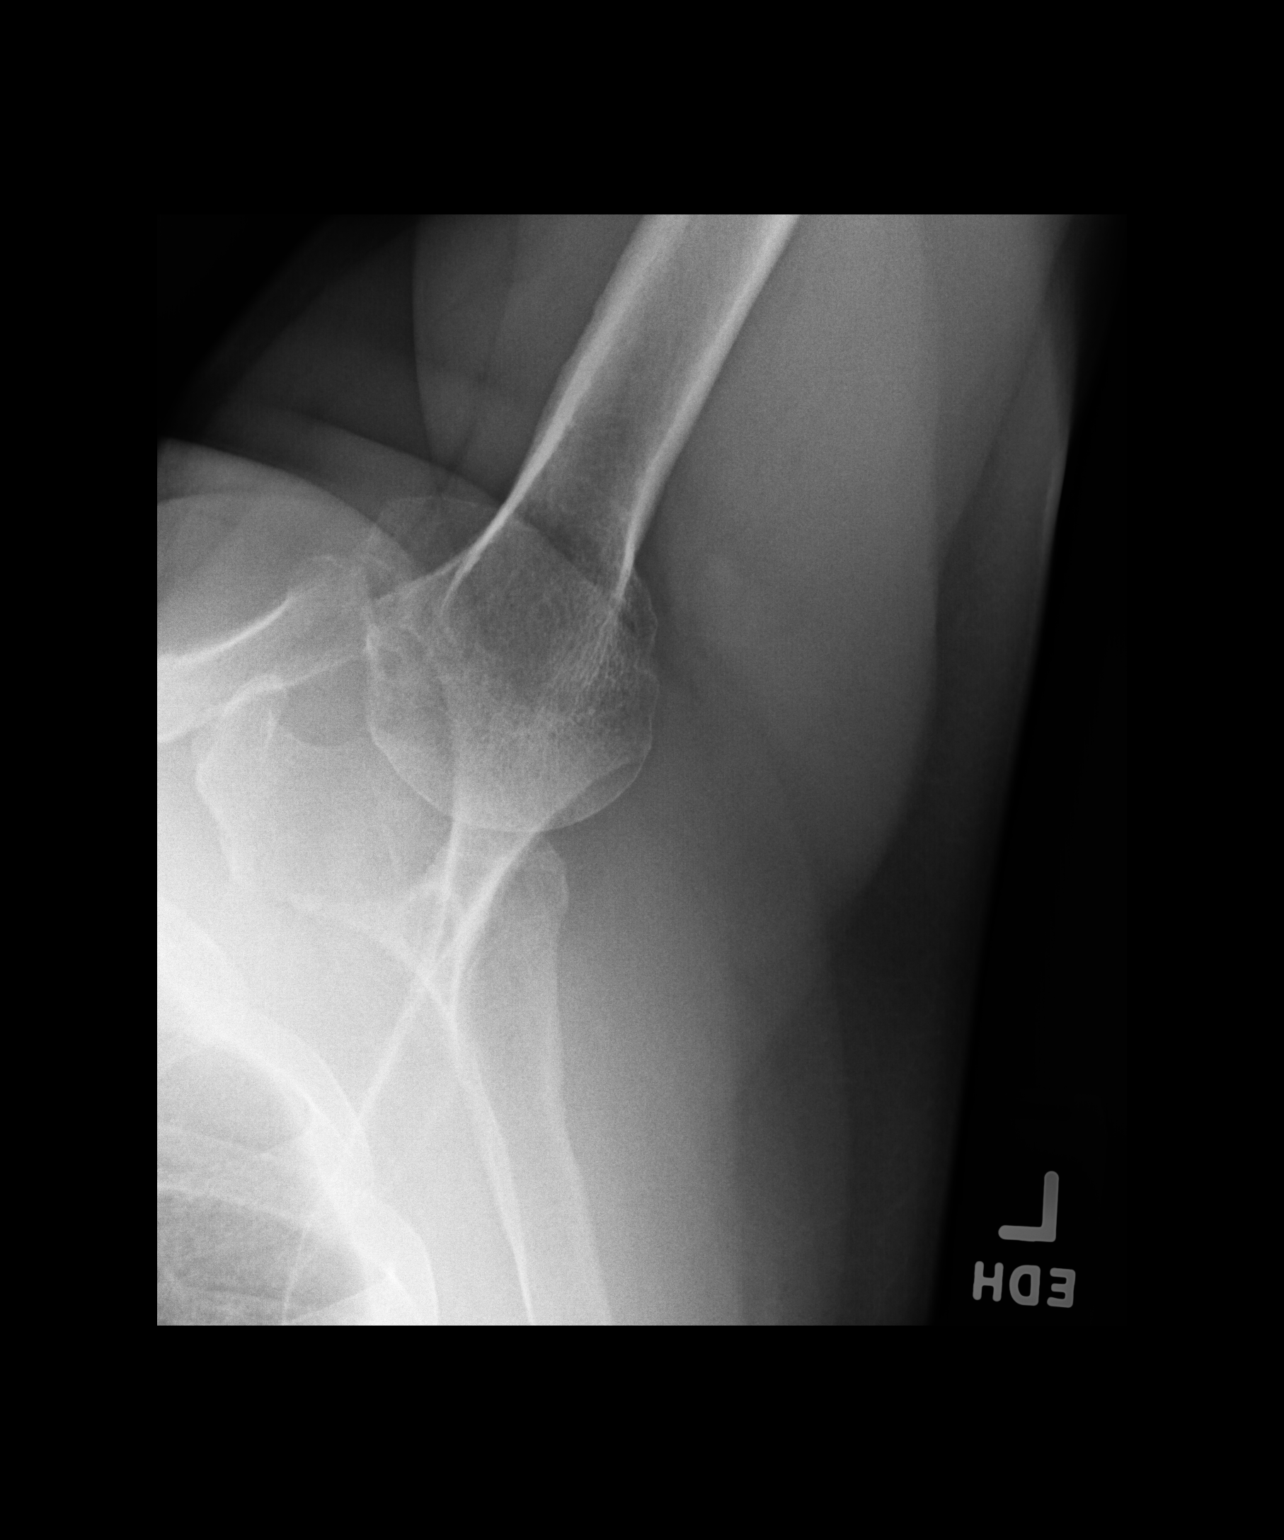

[3 of 3 positions shown; findings below may reference images not displayed]

FINDINGS: Glenohumeral joint is normal. Mild degenerative change noted at the
AC joint. Other regional bone structures appear normal.
IMPRESSION: Mild degenerative change of the AC joint.

## 2020-02-10 MED FILL — AMLODIPINE BESYLATE 5 MG TA: 5 | 30 days supply | Qty: 30 | Fill #8

## 2020-03-07 DIAGNOSIS — Z6835 Body mass index (BMI) 35.0-35.9, adult: Secondary | ICD-10-CM | POA: Diagnosis not present

## 2020-03-07 DIAGNOSIS — I10 Essential (primary) hypertension: Secondary | ICD-10-CM | POA: Diagnosis not present

## 2020-03-14 MED FILL — AMLODIPINE BESYLATE 5 MG TA: 5 | 30 days supply | Qty: 30 | Fill #9

## 2020-04-04 MED FILL — LISINOPRIL 10 MG TABS: 10 | 90 days supply | Qty: 90 | Fill #2

## 2020-04-15 MED FILL — AMLODIPINE BESYLATE 5 MG TA: 5 | 30 days supply | Qty: 30 | Fill #10

## 2020-05-20 ENCOUNTER — Other Ambulatory Visit (HOSPITAL_COMMUNITY): Payer: Self-pay | Admitting: Geriatric Medicine

## 2020-05-20 MED FILL — AMLODIPINE BESYLATE 5 MG TA: 5 | 30 days supply | Qty: 30 | Fill #0

## 2020-05-25 DIAGNOSIS — H524 Presbyopia: Secondary | ICD-10-CM | POA: Diagnosis not present

## 2020-06-21 MED FILL — AMLODIPINE BESYLATE 5 MG TA: 5 | 30 days supply | Qty: 30 | Fill #1

## 2020-07-01 ENCOUNTER — Other Ambulatory Visit (HOSPITAL_COMMUNITY): Payer: Self-pay | Admitting: Geriatric Medicine

## 2020-07-01 DIAGNOSIS — F900 Attention-deficit hyperactivity disorder, predominantly inattentive type: Secondary | ICD-10-CM | POA: Diagnosis not present

## 2020-07-01 DIAGNOSIS — Z0000 Encounter for general adult medical examination without abnormal findings: Secondary | ICD-10-CM | POA: Diagnosis not present

## 2020-07-01 DIAGNOSIS — Z79899 Other long term (current) drug therapy: Secondary | ICD-10-CM | POA: Diagnosis not present

## 2020-07-01 DIAGNOSIS — Z6835 Body mass index (BMI) 35.0-35.9, adult: Secondary | ICD-10-CM | POA: Diagnosis not present

## 2020-07-01 DIAGNOSIS — I10 Essential (primary) hypertension: Secondary | ICD-10-CM | POA: Diagnosis not present

## 2020-07-01 DIAGNOSIS — Z23 Encounter for immunization: Secondary | ICD-10-CM | POA: Diagnosis not present

## 2020-07-01 DIAGNOSIS — F331 Major depressive disorder, recurrent, moderate: Secondary | ICD-10-CM | POA: Diagnosis not present

## 2020-07-01 MED FILL — PAROXETINE ER 12.5 MG TAB: 12.5 | 30 days supply | Qty: 30 | Fill #0

## 2020-07-01 MED FILL — LISINOPRIL 10 MG TABS: 10 | 90 days supply | Qty: 90 | Fill #3

## 2020-07-20 MED FILL — AMLODIPINE BESYLATE 5 MG TA: 5 | 30 days supply | Qty: 30 | Fill #2

## 2020-07-29 ENCOUNTER — Other Ambulatory Visit (HOSPITAL_COMMUNITY): Payer: Self-pay | Admitting: Geriatric Medicine

## 2020-07-29 MED FILL — PAROXETINE ER 25 MG TABLET: 25 | 30 days supply | Qty: 30 | Fill #0

## 2020-08-17 MED FILL — AMLODIPINE BESYLATE 5 MG TA: 5 | 30 days supply | Qty: 30 | Fill #3

## 2020-09-06 MED FILL — PAROXETINE ER 25 MG TABLET: 25 | 30 days supply | Qty: 30 | Fill #1

## 2020-09-06 MED FILL — PARoxetine HCL ER 12.5 MG T: 12.5 | 30 days supply | Qty: 30 | Fill #1

## 2020-09-22 MED FILL — AMLODIPINE BESYLATE 5 MG TA: 5 | 30 days supply | Qty: 30 | Fill #4

## 2020-10-06 DIAGNOSIS — Z85828 Personal history of other malignant neoplasm of skin: Secondary | ICD-10-CM | POA: Diagnosis not present

## 2020-10-06 DIAGNOSIS — L719 Rosacea, unspecified: Secondary | ICD-10-CM | POA: Diagnosis not present

## 2020-10-06 DIAGNOSIS — D485 Neoplasm of uncertain behavior of skin: Secondary | ICD-10-CM | POA: Diagnosis not present

## 2020-10-21 ENCOUNTER — Other Ambulatory Visit (HOSPITAL_COMMUNITY): Payer: Self-pay | Admitting: Geriatric Medicine

## 2020-10-21 MED FILL — LISINOPRIL 10 MG TABS: 10 | 90 days supply | Qty: 90 | Fill #0

## 2020-10-24 MED FILL — AMLODIPINE BESYLATE 5 MG TA: 5 | 30 days supply | Qty: 30 | Fill #5

## 2020-11-04 MED FILL — PAROXETINE ER 25 MG TABLET: 25 | 30 days supply | Qty: 30 | Fill #2

## 2020-11-04 MED FILL — PARoxetine HCL ER 12.5 MG T: 12.5 | 30 days supply | Qty: 30 | Fill #2

## 2020-11-29 ENCOUNTER — Other Ambulatory Visit (HOSPITAL_COMMUNITY): Payer: Self-pay

## 2020-11-29 MED FILL — Amlodipine Besylate Tab 5 MG (Base Equivalent): ORAL | 30 days supply | Qty: 30 | Fill #0 | Status: AC

## 2020-12-22 ENCOUNTER — Other Ambulatory Visit (HOSPITAL_COMMUNITY): Payer: Self-pay

## 2020-12-22 MED ORDER — PAROXETINE HCL ER 12.5 MG PO TB24
12.5000 mg | ORAL_TABLET | Freq: Every morning | ORAL | 2 refills | Status: AC
  Filled 2020-12-22: qty 90, 90d supply, fill #0
  Filled 2021-04-05 – 2021-05-18 (×4): qty 90, 90d supply, fill #1

## 2020-12-22 MED FILL — Paroxetine HCl Tab ER 24HR 12.5 MG: ORAL | 30 days supply | Qty: 30 | Fill #0 | Status: CN

## 2020-12-23 ENCOUNTER — Other Ambulatory Visit (HOSPITAL_COMMUNITY): Payer: Self-pay

## 2020-12-30 ENCOUNTER — Other Ambulatory Visit (HOSPITAL_COMMUNITY): Payer: Self-pay

## 2020-12-30 DIAGNOSIS — H900 Conductive hearing loss, bilateral: Secondary | ICD-10-CM | POA: Diagnosis not present

## 2020-12-30 DIAGNOSIS — Z79899 Other long term (current) drug therapy: Secondary | ICD-10-CM | POA: Diagnosis not present

## 2020-12-30 DIAGNOSIS — Z6835 Body mass index (BMI) 35.0-35.9, adult: Secondary | ICD-10-CM | POA: Diagnosis not present

## 2020-12-30 DIAGNOSIS — H6123 Impacted cerumen, bilateral: Secondary | ICD-10-CM | POA: Diagnosis not present

## 2020-12-30 DIAGNOSIS — I10 Essential (primary) hypertension: Secondary | ICD-10-CM | POA: Diagnosis not present

## 2021-01-04 ENCOUNTER — Other Ambulatory Visit (HOSPITAL_COMMUNITY): Payer: Self-pay

## 2021-01-04 MED FILL — Amlodipine Besylate Tab 5 MG (Base Equivalent): ORAL | 30 days supply | Qty: 30 | Fill #1 | Status: CN

## 2021-01-04 MED FILL — Amlodipine Besylate Tab 5 MG (Base Equivalent): ORAL | 90 days supply | Qty: 90 | Fill #1 | Status: AC

## 2021-02-03 ENCOUNTER — Other Ambulatory Visit (HOSPITAL_COMMUNITY): Payer: Self-pay

## 2021-02-23 ENCOUNTER — Other Ambulatory Visit (HOSPITAL_COMMUNITY): Payer: Self-pay

## 2021-02-23 MED FILL — Lisinopril Tab 10 MG: ORAL | 90 days supply | Qty: 90 | Fill #0 | Status: AC

## 2021-03-14 DIAGNOSIS — Z1211 Encounter for screening for malignant neoplasm of colon: Secondary | ICD-10-CM | POA: Diagnosis not present

## 2021-03-14 DIAGNOSIS — R198 Other specified symptoms and signs involving the digestive system and abdomen: Secondary | ICD-10-CM | POA: Diagnosis not present

## 2021-04-05 ENCOUNTER — Other Ambulatory Visit (HOSPITAL_COMMUNITY): Payer: Self-pay

## 2021-04-05 MED ORDER — PEG 3350-KCL-NA BICARB-NACL 420 G PO SOLR
ORAL | 0 refills | Status: AC
  Filled 2021-04-05: qty 4000, 1d supply, fill #0

## 2021-04-05 MED FILL — Amlodipine Besylate Tab 5 MG (Base Equivalent): ORAL | 60 days supply | Qty: 60 | Fill #2 | Status: AC

## 2021-04-05 MED FILL — Paroxetine HCl Tab ER 24HR 25 MG: ORAL | 30 days supply | Qty: 30 | Fill #0 | Status: CN

## 2021-04-06 ENCOUNTER — Other Ambulatory Visit (HOSPITAL_COMMUNITY): Payer: Self-pay

## 2021-04-14 DIAGNOSIS — Z1211 Encounter for screening for malignant neoplasm of colon: Secondary | ICD-10-CM | POA: Diagnosis not present

## 2021-04-19 ENCOUNTER — Other Ambulatory Visit (HOSPITAL_COMMUNITY): Payer: Self-pay

## 2021-05-18 ENCOUNTER — Other Ambulatory Visit (HOSPITAL_COMMUNITY): Payer: Self-pay

## 2021-05-18 MED FILL — Paroxetine HCl Tab ER 24HR 25 MG: ORAL | 30 days supply | Qty: 30 | Fill #0 | Status: CN

## 2021-05-19 ENCOUNTER — Other Ambulatory Visit (HOSPITAL_COMMUNITY): Payer: Self-pay

## 2021-05-19 MED ORDER — PAROXETINE HCL 10 MG PO TABS
10.0000 mg | ORAL_TABLET | Freq: Every day | ORAL | 2 refills | Status: AC
  Filled 2021-05-19: qty 90, 90d supply, fill #0
  Filled 2021-08-30: qty 90, 90d supply, fill #1
  Filled 2021-11-10 – 2021-12-12 (×2): qty 90, 90d supply, fill #0

## 2021-05-29 ENCOUNTER — Other Ambulatory Visit (HOSPITAL_COMMUNITY): Payer: Self-pay

## 2021-05-29 MED FILL — Lisinopril Tab 10 MG: ORAL | 90 days supply | Qty: 90 | Fill #1 | Status: AC

## 2021-06-05 ENCOUNTER — Other Ambulatory Visit (HOSPITAL_COMMUNITY): Payer: Self-pay

## 2021-06-06 ENCOUNTER — Other Ambulatory Visit (HOSPITAL_COMMUNITY): Payer: Self-pay

## 2021-06-06 MED ORDER — AMLODIPINE BESYLATE 5 MG PO TABS
5.0000 mg | ORAL_TABLET | Freq: Every day | ORAL | 12 refills | Status: AC
  Filled 2021-06-06: qty 30, 30d supply, fill #0
  Filled 2021-07-10: qty 30, 30d supply, fill #1

## 2021-06-07 ENCOUNTER — Other Ambulatory Visit (HOSPITAL_COMMUNITY): Payer: Self-pay

## 2021-07-10 ENCOUNTER — Other Ambulatory Visit (HOSPITAL_COMMUNITY): Payer: Self-pay

## 2021-07-14 ENCOUNTER — Other Ambulatory Visit (HOSPITAL_COMMUNITY): Payer: Self-pay

## 2021-07-14 DIAGNOSIS — Z125 Encounter for screening for malignant neoplasm of prostate: Secondary | ICD-10-CM | POA: Diagnosis not present

## 2021-07-14 DIAGNOSIS — Z23 Encounter for immunization: Secondary | ICD-10-CM | POA: Diagnosis not present

## 2021-07-14 DIAGNOSIS — F900 Attention-deficit hyperactivity disorder, predominantly inattentive type: Secondary | ICD-10-CM | POA: Diagnosis not present

## 2021-07-14 DIAGNOSIS — Z79899 Other long term (current) drug therapy: Secondary | ICD-10-CM | POA: Diagnosis not present

## 2021-07-14 DIAGNOSIS — I10 Essential (primary) hypertension: Secondary | ICD-10-CM | POA: Diagnosis not present

## 2021-07-14 DIAGNOSIS — Z13220 Encounter for screening for lipoid disorders: Secondary | ICD-10-CM | POA: Diagnosis not present

## 2021-07-14 DIAGNOSIS — Z6835 Body mass index (BMI) 35.0-35.9, adult: Secondary | ICD-10-CM | POA: Diagnosis not present

## 2021-07-14 DIAGNOSIS — Z0000 Encounter for general adult medical examination without abnormal findings: Secondary | ICD-10-CM | POA: Diagnosis not present

## 2021-07-14 MED ORDER — LISINOPRIL 10 MG PO TABS
10.0000 mg | ORAL_TABLET | Freq: Two times a day (BID) | ORAL | 3 refills | Status: DC
  Filled 2021-07-14 – 2022-01-30 (×3): qty 180, 90d supply, fill #0
  Filled 2022-05-08: qty 180, 90d supply, fill #1

## 2021-07-26 ENCOUNTER — Ambulatory Visit: Payer: 59 | Admitting: Family Medicine

## 2021-07-26 VITALS — BP 160/92 | Ht 70.0 in | Wt 254.0 lb

## 2021-07-26 DIAGNOSIS — S8991XA Unspecified injury of right lower leg, initial encounter: Secondary | ICD-10-CM | POA: Diagnosis not present

## 2021-07-26 NOTE — Patient Instructions (Signed)
I'm concerned you tore your medial meniscus. Most of these heal without needing surgery. Start home exercises and do these most days of the week. Voltaren gel up to 4 times a day topically. Tylenol 500mg  1-2 tabs three times a day as needed. Use the naproxen/aleve sparingly if needed. Consider knee brace for support. Consider formal physical therapy, MRI, injection if not improving over the next 4-6 weeks. Follow up with me either before or after the Christmas break.

## 2021-07-26 NOTE — Progress Notes (Signed)
  Christian Smith - 60 y.o. 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  Patient reports he was deer hunting in [place] in October about 5 weeks ago, when he stepped in a hole in the ground and twisted his knee.  After the injury he elevated the knee that evening and woke the next morning with swelling of the knee.  He has had persistent pain since his injury, that has progressively worsened.  At home he takes naproxen p.o., Voltaren gel, Tylenol, using compressive knee brace with minimal improvement.  He reports occasional clicking/popping sensation when getting up out of chair.  He has pain that worsens with walking and external rotation of his lower leg.  He denies burning, tingling, numbness.  He is at the point where he has throbbing pain at rest 4-5/10 and presents for further evaluation.  No catching, locking but can feel unstable.  ROS: Negative aside from above.  PHYSICAL EXAM:  VS: BP:(!) 160/92  HR: bpm  TEMP: ( )  RESP:   HT:5\' 10"  (177.8 cm)   WT:254 lb (115.2 kg)  BMI:36.45 PHYSICAL EXAM: Gen: NAD, alert, cooperative with exam, well-appearing HEENT: clear conjunctiva, nontraumatic CV:  no peripheral edema, extremities well perfused Resp: non-labored breathing on room air Skin: no rashes, normal turgor  Neuro: no gross deficits  Psych:  alert and oriented MSK: Mild effusion right knee compared to left.  Right knee calor compared to left without erythema.  Patient is able to achieve full knee extension however has pain with full knee extension.  Pain with resisted extension.  No pain with knee flexion.  Anterior drawer, posterior drawer, valgus stress, varus stress testing negative.  Pain with valgus stress test.  Positive McMurray, apleys.  Unable to perform thessaly's due to  pain.  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  Clinical presentation and exam concerning for medial meniscus injury and/or mild MCL sprain.  Discussed treatment options including more conservative management with knee joint stabilizing strengthening exercises and RICE therapy.  Discussed option of CSI though patient in agreement with trying conservative management first.  Discussed possible utility of obtaining MRI right knee, though likely would not change management.  Patient in agreement with holding off on MRI. Plan: -Continue Tylenol 500 mg or 1000 mg 3 times daily as needed -Can use naproxen as needed though would prefer to limit use in the setting of hypertension -Can continue knee compressive brace -HEP quadriceps strengthening -If pain is not improving over the next 4 to 6 weeks can consider obtaining MRI, CSI, formal physical therapy -Follow-up 3 to 4 weeks for reevaluation  Portions of this report may have been transcribed using voice recognition software. Every effort was made to ensure accuracy; however, inadvertent computerized transcription errors may be present.   Christian Denis, MD 07/26/21,  2:30 PM Pager: [phone] Internal Medicine Resident, PGY-1 [facility]

## 2021-07-27 ENCOUNTER — Encounter: Payer: Self-pay | Admitting: Family Medicine

## 2021-07-27 ENCOUNTER — Other Ambulatory Visit (HOSPITAL_COMMUNITY): Payer: Self-pay

## 2021-07-27 MED ORDER — AMLODIPINE BESYLATE 10 MG PO TABS
10.0000 mg | ORAL_TABLET | Freq: Every day | ORAL | 3 refills | Status: DC
  Filled 2021-07-27 – 2021-11-10 (×2): qty 90, 90d supply, fill #0
  Filled 2022-03-05: qty 90, 90d supply, fill #1
  Filled 2022-06-05: qty 90, 90d supply, fill #2

## 2021-08-15 ENCOUNTER — Other Ambulatory Visit (HOSPITAL_COMMUNITY): Payer: Self-pay

## 2021-08-15 MED FILL — Lisinopril Tab 10 MG: ORAL | 90 days supply | Qty: 90 | Fill #2 | Status: AC

## 2021-08-17 DIAGNOSIS — H5203 Hypermetropia, bilateral: Secondary | ICD-10-CM | POA: Diagnosis not present

## 2021-08-30 ENCOUNTER — Ambulatory Visit: Payer: No Typology Code available for payment source | Admitting: Family Medicine

## 2021-08-30 ENCOUNTER — Other Ambulatory Visit (HOSPITAL_COMMUNITY): Payer: Self-pay

## 2021-08-30 ENCOUNTER — Encounter: Payer: Self-pay | Admitting: Family Medicine

## 2021-08-30 VITALS — BP 134/72 | Ht 70.5 in | Wt 254.0 lb

## 2021-08-30 DIAGNOSIS — S8991XD Unspecified injury of right lower leg, subsequent encounter: Secondary | ICD-10-CM

## 2021-08-30 NOTE — Progress Notes (Signed)
PCP: Lajean Manes, MD  Subjective:   HPI: Patient is a 61 y.o. male here for right knee pain.  Patient reports he is doing much better. Only has some pain if pushes hard on medial aspect of right knee now. Doing home exercises regularly. No longer using voltaren gel. Used knee brace for support. No catching, locking, giving out.  History reviewed. No pertinent past medical history.  Current Outpatient Medications on File Prior to Visit  Medication Sig Dispense Refill   amLODipine (NORVASC) 10 MG tablet Take 1 tablet (10 mg total) by mouth daily. 90 tablet 3   amLODipine (NORVASC) 5 MG tablet      amLODipine (NORVASC) 5 MG tablet TAKE 1 TABLET BY MOUTH ONCE A DAY 30 tablet 11   amLODipine (NORVASC) 5 MG tablet Take 1 tablet (5 mg total) by mouth daily. 30 tablet 12   lisinopril (PRINIVIL,ZESTRIL) 10 MG tablet   3   lisinopril (ZESTRIL) 10 MG tablet TAKE 1 TABLET BY MOUTH ONCE DAILY. 90 tablet 3   lisinopril (ZESTRIL) 10 MG tablet Take 1 tablet (10 mg total) by mouth 2 (two) times daily. 180 tablet 3   naproxen sodium (ALEVE) 220 MG tablet Take 220 mg by mouth.     PARoxetine (PAXIL) 10 MG tablet Take 1 tablet (10 mg total) by mouth daily in the morning 90 tablet 2   PARoxetine (PAXIL-CR) 12.5 MG 24 hr tablet TAKE 1 TABLET BY MOUTH IN THE MORNING ONCE A DAY 30 tablet 6   PARoxetine (PAXIL-CR) 12.5 MG 24 hr tablet Take 1 tablet by mouth once every morning 90 tablet 2   PARoxetine (PAXIL-CR) 25 MG 24 hr tablet TAKE 1 TABLET BY MOUTH ONCE A DAY IN THE MORNING 30 tablet 6   polyethylene glycol-electrolytes (NULYTELY) 420 g solution Use as directed 4000 mL 0   No current facility-administered medications on file prior to visit.    History reviewed. No pertinent surgical history.  No Known Allergies  BP 134/72    Ht 5' 10.5" (1.791 m)    Wt 254 lb (115.2 kg)    BMI 35.93 kg/m   [facility] 07/26/2021 08/30/2021  Frequency of aerobic exercise (# of  days/week) 3 3  Average time in minutes 30 30  Frequency of strengthening activities (# of days/week) 3 3    No flowsheet data found.      Objective:  Physical Exam:  Gen: NAD, comfortable in exam room  Right knee: No gross deformity, ecchymoses, swelling. No TTP. FROM with normal strength. Negative ant/post drawers. Negative valgus/varus testing. Negative lachman. Negative mcmurrays, apleys, thessalys. NV intact distally.    Assessment & Plan:  1. Right knee pain - 2/2 medial meniscus tear.  Clinically improving with conservative treatment.  Continue home exercises next 4 weeks. F/u prn.

## 2021-10-19 ENCOUNTER — Other Ambulatory Visit (HOSPITAL_COMMUNITY): Payer: Self-pay

## 2021-10-20 ENCOUNTER — Other Ambulatory Visit (HOSPITAL_COMMUNITY): Payer: Self-pay

## 2021-10-23 ENCOUNTER — Other Ambulatory Visit (HOSPITAL_COMMUNITY): Payer: Self-pay

## 2021-11-10 ENCOUNTER — Other Ambulatory Visit (HOSPITAL_COMMUNITY): Payer: Self-pay

## 2021-12-12 ENCOUNTER — Other Ambulatory Visit (HOSPITAL_COMMUNITY): Payer: Self-pay

## 2022-01-29 ENCOUNTER — Other Ambulatory Visit (HOSPITAL_COMMUNITY): Payer: Self-pay

## 2022-01-29 MED ORDER — HYDROCHLOROTHIAZIDE 12.5 MG PO TABS
ORAL_TABLET | ORAL | 3 refills | Status: DC
  Filled 2022-01-29: qty 30, 30d supply, fill #0
  Filled 2022-03-05: qty 30, 30d supply, fill #1
  Filled 2022-04-04: qty 30, 30d supply, fill #2
  Filled 2022-06-05: qty 30, 30d supply, fill #3

## 2022-01-30 ENCOUNTER — Other Ambulatory Visit (HOSPITAL_COMMUNITY): Payer: Self-pay

## 2022-03-05 ENCOUNTER — Other Ambulatory Visit (HOSPITAL_COMMUNITY): Payer: Self-pay

## 2022-04-04 ENCOUNTER — Other Ambulatory Visit (HOSPITAL_COMMUNITY): Payer: Self-pay

## 2022-05-08 ENCOUNTER — Other Ambulatory Visit (HOSPITAL_COMMUNITY): Payer: Self-pay

## 2022-06-05 ENCOUNTER — Other Ambulatory Visit (HOSPITAL_COMMUNITY): Payer: Self-pay

## 2022-07-31 ENCOUNTER — Other Ambulatory Visit (HOSPITAL_COMMUNITY): Payer: Self-pay

## 2022-08-02 ENCOUNTER — Other Ambulatory Visit (HOSPITAL_COMMUNITY): Payer: Self-pay

## 2022-08-02 MED ORDER — LISINOPRIL 10 MG PO TABS
10.0000 mg | ORAL_TABLET | Freq: Two times a day (BID) | ORAL | 3 refills | Status: AC
  Filled 2022-08-02: qty 180, 90d supply, fill #0
  Filled 2022-11-13: qty 180, 90d supply, fill #1
  Filled 2023-03-01: qty 180, 90d supply, fill #2
  Filled 2023-06-04: qty 180, 90d supply, fill #3

## 2022-08-17 ENCOUNTER — Other Ambulatory Visit (HOSPITAL_COMMUNITY): Payer: Self-pay

## 2022-08-17 MED ORDER — HYDROCHLOROTHIAZIDE 12.5 MG PO TABS
ORAL_TABLET | ORAL | 3 refills | Status: AC
  Filled 2022-08-17: qty 90, 90d supply, fill #0
  Filled 2022-11-13: qty 90, 90d supply, fill #1

## 2022-08-17 MED ORDER — NALTREXONE HCL 50 MG PO TABS
ORAL_TABLET | ORAL | 3 refills | Status: AC
  Filled 2022-08-17: qty 30, 20d supply, fill #0
  Filled 2022-09-06: qty 30, 21d supply, fill #1
  Filled 2022-09-20: qty 30, 21d supply, fill #2
  Filled 2022-10-11: qty 30, 15d supply, fill #3

## 2022-08-17 MED ORDER — ASPIRIN 81 MG PO TBEC
81.0000 mg | DELAYED_RELEASE_TABLET | Freq: Every day | ORAL | 0 refills | Status: DC
  Filled 2022-08-17: qty 30, 30d supply, fill #0

## 2022-09-03 ENCOUNTER — Other Ambulatory Visit (HOSPITAL_COMMUNITY): Payer: Self-pay

## 2022-09-04 ENCOUNTER — Other Ambulatory Visit (HOSPITAL_COMMUNITY): Payer: Self-pay

## 2022-09-06 ENCOUNTER — Other Ambulatory Visit (HOSPITAL_COMMUNITY): Payer: Self-pay

## 2022-09-06 MED ORDER — AMLODIPINE BESYLATE 10 MG PO TABS
10.0000 mg | ORAL_TABLET | Freq: Every day | ORAL | 3 refills | Status: AC
  Filled 2022-09-06: qty 90, 90d supply, fill #0
  Filled 2022-12-18: qty 90, 90d supply, fill #1
  Filled 2023-04-05: qty 90, 90d supply, fill #2
  Filled 2023-07-03: qty 90, 90d supply, fill #3

## 2022-09-07 ENCOUNTER — Other Ambulatory Visit (HOSPITAL_COMMUNITY): Payer: Self-pay

## 2022-09-07 DIAGNOSIS — I10 Essential (primary) hypertension: Secondary | ICD-10-CM | POA: Diagnosis not present

## 2022-09-20 ENCOUNTER — Other Ambulatory Visit (HOSPITAL_COMMUNITY): Payer: Self-pay

## 2022-10-11 ENCOUNTER — Other Ambulatory Visit (HOSPITAL_COMMUNITY): Payer: Self-pay

## 2022-10-11 ENCOUNTER — Other Ambulatory Visit: Payer: Self-pay

## 2022-11-13 ENCOUNTER — Other Ambulatory Visit: Payer: Self-pay

## 2022-11-19 ENCOUNTER — Other Ambulatory Visit (HOSPITAL_COMMUNITY): Payer: Self-pay

## 2022-11-19 MED ORDER — ASPIRIN 81 MG PO TBEC
81.0000 mg | DELAYED_RELEASE_TABLET | Freq: Every day | ORAL | 0 refills | Status: DC
  Filled 2022-11-19: qty 30, 30d supply, fill #0

## 2022-11-20 ENCOUNTER — Other Ambulatory Visit (HOSPITAL_COMMUNITY): Payer: Self-pay

## 2022-11-20 MED ORDER — ASPIRIN 81 MG PO TBEC: 81.0000 mg | DELAYED_RELEASE_TABLET | Freq: Every day | ORAL | 3 refills | Status: AC

## 2022-11-23 ENCOUNTER — Other Ambulatory Visit (HOSPITAL_COMMUNITY): Payer: Self-pay

## 2022-12-18 ENCOUNTER — Other Ambulatory Visit (HOSPITAL_COMMUNITY): Payer: Self-pay

## 2022-12-18 MED ORDER — ASPIRIN 81 MG PO TBEC
81.0000 mg | DELAYED_RELEASE_TABLET | Freq: Every day | ORAL | 3 refills | Status: AC
  Filled 2022-12-18: qty 90, 90d supply, fill #0
  Filled 2023-04-05: qty 90, 90d supply, fill #1
  Filled 2023-08-17: qty 90, 90d supply, fill #2
  Filled 2023-12-16: qty 90, 90d supply, fill #3

## 2022-12-19 ENCOUNTER — Other Ambulatory Visit (HOSPITAL_COMMUNITY): Payer: Self-pay

## 2022-12-19 MED ORDER — SILDENAFIL CITRATE 20 MG PO TABS
20.0000 mg | ORAL_TABLET | Freq: Every day | ORAL | 3 refills | Status: DC
  Filled 2022-12-19: qty 60, 20d supply, fill #0
  Filled 2023-03-01: qty 60, 20d supply, fill #1

## 2023-02-15 ENCOUNTER — Other Ambulatory Visit (HOSPITAL_COMMUNITY): Payer: Self-pay

## 2023-02-15 DIAGNOSIS — F1090 Alcohol use, unspecified, uncomplicated: Secondary | ICD-10-CM | POA: Diagnosis not present

## 2023-02-15 DIAGNOSIS — R7303 Prediabetes: Secondary | ICD-10-CM | POA: Diagnosis not present

## 2023-02-15 DIAGNOSIS — I10 Essential (primary) hypertension: Secondary | ICD-10-CM | POA: Diagnosis not present

## 2023-02-15 DIAGNOSIS — R6882 Decreased libido: Secondary | ICD-10-CM | POA: Diagnosis not present

## 2023-02-15 DIAGNOSIS — R5383 Other fatigue: Secondary | ICD-10-CM | POA: Diagnosis not present

## 2023-02-15 DIAGNOSIS — R457 State of emotional shock and stress, unspecified: Secondary | ICD-10-CM | POA: Diagnosis not present

## 2023-02-15 MED ORDER — HYDROCHLOROTHIAZIDE 25 MG PO TABS
25.0000 mg | ORAL_TABLET | Freq: Every evening | ORAL | 3 refills | Status: DC
  Filled 2023-02-15: qty 90, 90d supply, fill #0
  Filled 2023-05-16: qty 90, 90d supply, fill #1
  Filled 2023-08-17: qty 90, 90d supply, fill #2
  Filled 2023-11-21: qty 90, 90d supply, fill #3

## 2023-03-01 ENCOUNTER — Other Ambulatory Visit (HOSPITAL_COMMUNITY): Payer: Self-pay

## 2023-03-01 DIAGNOSIS — R6882 Decreased libido: Secondary | ICD-10-CM | POA: Diagnosis not present

## 2023-03-01 DIAGNOSIS — R946 Abnormal results of thyroid function studies: Secondary | ICD-10-CM | POA: Diagnosis not present

## 2023-03-01 DIAGNOSIS — I10 Essential (primary) hypertension: Secondary | ICD-10-CM | POA: Diagnosis not present

## 2023-03-01 DIAGNOSIS — R5383 Other fatigue: Secondary | ICD-10-CM | POA: Diagnosis not present

## 2023-03-02 ENCOUNTER — Other Ambulatory Visit (HOSPITAL_COMMUNITY): Payer: Self-pay

## 2023-03-07 ENCOUNTER — Other Ambulatory Visit (HOSPITAL_COMMUNITY): Payer: Self-pay

## 2023-03-07 MED ORDER — LEVOTHYROXINE SODIUM 25 MCG PO TABS
ORAL_TABLET | ORAL | 3 refills | Status: DC
  Filled 2023-03-07: qty 30, 30d supply, fill #0
  Filled 2023-04-05: qty 30, 30d supply, fill #1
  Filled 2023-05-07: qty 30, 30d supply, fill #2
  Filled 2023-06-04: qty 30, 30d supply, fill #3

## 2023-04-23 DIAGNOSIS — E039 Hypothyroidism, unspecified: Secondary | ICD-10-CM | POA: Diagnosis not present

## 2023-05-07 ENCOUNTER — Other Ambulatory Visit (HOSPITAL_COMMUNITY): Payer: Self-pay

## 2023-05-16 ENCOUNTER — Other Ambulatory Visit (HOSPITAL_COMMUNITY): Payer: Self-pay

## 2023-06-06 ENCOUNTER — Other Ambulatory Visit (HOSPITAL_COMMUNITY): Payer: Self-pay

## 2023-06-06 MED ORDER — LEVOTHYROXINE SODIUM 25 MCG PO TABS
25.0000 ug | ORAL_TABLET | Freq: Every day | ORAL | 3 refills | Status: DC
  Filled 2023-06-06 – 2023-07-03 (×2): qty 90, 90d supply, fill #0
  Filled 2023-10-03: qty 90, 90d supply, fill #1
  Filled 2024-01-02: qty 90, 90d supply, fill #2
  Filled 2024-04-01: qty 90, 90d supply, fill #3

## 2023-07-03 ENCOUNTER — Other Ambulatory Visit (HOSPITAL_COMMUNITY): Payer: Self-pay

## 2023-07-03 ENCOUNTER — Other Ambulatory Visit: Payer: Self-pay

## 2023-08-22 ENCOUNTER — Other Ambulatory Visit (HOSPITAL_COMMUNITY): Payer: Self-pay

## 2023-09-20 ENCOUNTER — Other Ambulatory Visit (HOSPITAL_COMMUNITY): Payer: Self-pay

## 2023-09-20 DIAGNOSIS — Z23 Encounter for immunization: Secondary | ICD-10-CM | POA: Diagnosis not present

## 2023-09-20 DIAGNOSIS — I10 Essential (primary) hypertension: Secondary | ICD-10-CM | POA: Diagnosis not present

## 2023-09-20 DIAGNOSIS — M25551 Pain in right hip: Secondary | ICD-10-CM | POA: Diagnosis not present

## 2023-09-20 DIAGNOSIS — F1090 Alcohol use, unspecified, uncomplicated: Secondary | ICD-10-CM | POA: Diagnosis not present

## 2023-09-20 DIAGNOSIS — R457 State of emotional shock and stress, unspecified: Secondary | ICD-10-CM | POA: Diagnosis not present

## 2023-09-20 DIAGNOSIS — Z125 Encounter for screening for malignant neoplasm of prostate: Secondary | ICD-10-CM | POA: Diagnosis not present

## 2023-09-20 DIAGNOSIS — R7303 Prediabetes: Secondary | ICD-10-CM | POA: Diagnosis not present

## 2023-09-20 DIAGNOSIS — Z79899 Other long term (current) drug therapy: Secondary | ICD-10-CM | POA: Diagnosis not present

## 2023-09-20 DIAGNOSIS — E039 Hypothyroidism, unspecified: Secondary | ICD-10-CM | POA: Diagnosis not present

## 2023-09-20 DIAGNOSIS — R5383 Other fatigue: Secondary | ICD-10-CM | POA: Diagnosis not present

## 2023-09-20 DIAGNOSIS — F900 Attention-deficit hyperactivity disorder, predominantly inattentive type: Secondary | ICD-10-CM | POA: Diagnosis not present

## 2023-09-20 DIAGNOSIS — E7800 Pure hypercholesterolemia, unspecified: Secondary | ICD-10-CM | POA: Diagnosis not present

## 2023-09-20 MED ORDER — CONTRAVE 8-90 MG PO TB12
1.0000 | ORAL_TABLET | Freq: Every morning | ORAL | 3 refills | Status: AC
  Filled 2023-09-20: qty 90, 90d supply, fill #0

## 2023-09-20 MED ORDER — LISINOPRIL 10 MG PO TABS
10.0000 mg | ORAL_TABLET | Freq: Two times a day (BID) | ORAL | 3 refills | Status: AC
  Filled 2023-09-20: qty 180, 90d supply, fill #0
  Filled 2023-12-25: qty 180, 90d supply, fill #1
  Filled 2024-04-01: qty 180, 90d supply, fill #2
  Filled 2024-07-08: qty 180, 90d supply, fill #3

## 2023-09-23 ENCOUNTER — Other Ambulatory Visit (HOSPITAL_COMMUNITY): Payer: Self-pay

## 2023-09-24 ENCOUNTER — Other Ambulatory Visit (HOSPITAL_COMMUNITY): Payer: Self-pay

## 2023-09-24 MED ORDER — NALTREXONE HCL 50 MG PO TABS
ORAL_TABLET | ORAL | 0 refills | Status: DC
  Filled 2023-09-24: qty 49, 30d supply, fill #0

## 2023-09-24 MED ORDER — "SYRINGE/NEEDLE (DISP) 18G X 1-1/2"" 3 ML MISC"
0 refills | Status: AC
  Filled 2023-09-24: qty 4, 28d supply, fill #0

## 2023-09-24 MED ORDER — BUPROPION HCL ER (XL) 150 MG PO TB24
150.0000 mg | ORAL_TABLET | Freq: Every morning | ORAL | 0 refills | Status: DC
  Filled 2023-09-24: qty 30, 30d supply, fill #0

## 2023-09-24 MED ORDER — "NEEDLE (DISP) 18G X 1"" MISC"
0 refills | Status: AC
  Filled 2023-09-24: qty 4, 28d supply, fill #0

## 2023-09-24 MED ORDER — CYANOCOBALAMIN 1000 MCG/ML IJ SOLN
1000.0000 ug | INTRAMUSCULAR | 0 refills | Status: AC
  Filled 2023-09-24: qty 4, 28d supply, fill #0

## 2023-10-12 ENCOUNTER — Other Ambulatory Visit (HOSPITAL_COMMUNITY): Payer: Self-pay

## 2023-10-14 ENCOUNTER — Other Ambulatory Visit (HOSPITAL_COMMUNITY): Payer: Self-pay

## 2023-10-22 ENCOUNTER — Other Ambulatory Visit (HOSPITAL_COMMUNITY): Payer: Self-pay

## 2023-10-22 MED ORDER — BUPROPION HCL ER (XL) 150 MG PO TB24
150.0000 mg | ORAL_TABLET | Freq: Every day | ORAL | 1 refills | Status: AC
  Filled 2023-10-22: qty 90, 90d supply, fill #0
  Filled 2024-01-22: qty 90, 90d supply, fill #1

## 2023-10-22 MED ORDER — NALTREXONE HCL 50 MG PO TABS
100.0000 mg | ORAL_TABLET | Freq: Every day | ORAL | 1 refills | Status: AC
  Filled 2023-10-22: qty 180, 90d supply, fill #0
  Filled 2024-01-22: qty 180, 90d supply, fill #1

## 2023-10-22 MED ORDER — AMLODIPINE BESYLATE 10 MG PO TABS
10.0000 mg | ORAL_TABLET | Freq: Every day | ORAL | 1 refills | Status: AC
  Filled 2023-10-22: qty 90, 90d supply, fill #0
  Filled 2024-01-29: qty 90, 90d supply, fill #1

## 2023-11-21 ENCOUNTER — Other Ambulatory Visit (HOSPITAL_COMMUNITY): Payer: Self-pay

## 2023-12-16 ENCOUNTER — Other Ambulatory Visit (HOSPITAL_COMMUNITY): Payer: Self-pay

## 2024-01-29 ENCOUNTER — Other Ambulatory Visit (HOSPITAL_COMMUNITY): Payer: Self-pay

## 2024-02-24 ENCOUNTER — Other Ambulatory Visit (HOSPITAL_BASED_OUTPATIENT_CLINIC_OR_DEPARTMENT_OTHER): Payer: Self-pay

## 2024-02-24 ENCOUNTER — Other Ambulatory Visit (HOSPITAL_COMMUNITY): Payer: Self-pay

## 2024-02-24 MED ORDER — HYDROCHLOROTHIAZIDE 25 MG PO TABS
25.0000 mg | ORAL_TABLET | Freq: Every morning | ORAL | 1 refills | Status: DC
  Filled 2024-02-24: qty 90, 90d supply, fill #0
  Filled 2024-05-20: qty 90, 90d supply, fill #1

## 2024-03-06 ENCOUNTER — Other Ambulatory Visit (HOSPITAL_COMMUNITY): Payer: Self-pay

## 2024-03-06 MED ORDER — SILDENAFIL CITRATE 20 MG PO TABS
20.0000 mg | ORAL_TABLET | Freq: Every day | ORAL | 3 refills | Status: AC | PRN
  Filled 2024-03-06: qty 60, 20d supply, fill #0
  Filled 2024-09-17: qty 60, 20d supply, fill #1

## 2024-03-09 ENCOUNTER — Other Ambulatory Visit (HOSPITAL_COMMUNITY): Payer: Self-pay

## 2024-03-18 DIAGNOSIS — E538 Deficiency of other specified B group vitamins: Secondary | ICD-10-CM | POA: Diagnosis not present

## 2024-03-18 DIAGNOSIS — R457 State of emotional shock and stress, unspecified: Secondary | ICD-10-CM | POA: Diagnosis not present

## 2024-03-18 DIAGNOSIS — R5383 Other fatigue: Secondary | ICD-10-CM | POA: Diagnosis not present

## 2024-03-18 DIAGNOSIS — F900 Attention-deficit hyperactivity disorder, predominantly inattentive type: Secondary | ICD-10-CM | POA: Diagnosis not present

## 2024-03-18 DIAGNOSIS — Z79899 Other long term (current) drug therapy: Secondary | ICD-10-CM | POA: Diagnosis not present

## 2024-03-18 DIAGNOSIS — I10 Essential (primary) hypertension: Secondary | ICD-10-CM | POA: Diagnosis not present

## 2024-03-18 DIAGNOSIS — F1090 Alcohol use, unspecified, uncomplicated: Secondary | ICD-10-CM | POA: Diagnosis not present

## 2024-03-18 DIAGNOSIS — R7303 Prediabetes: Secondary | ICD-10-CM | POA: Diagnosis not present

## 2024-03-18 DIAGNOSIS — E039 Hypothyroidism, unspecified: Secondary | ICD-10-CM | POA: Diagnosis not present

## 2024-03-23 DIAGNOSIS — R5383 Other fatigue: Secondary | ICD-10-CM | POA: Diagnosis not present

## 2024-03-23 DIAGNOSIS — E559 Vitamin D deficiency, unspecified: Secondary | ICD-10-CM | POA: Diagnosis not present

## 2024-03-23 DIAGNOSIS — E538 Deficiency of other specified B group vitamins: Secondary | ICD-10-CM | POA: Diagnosis not present

## 2024-04-08 ENCOUNTER — Other Ambulatory Visit (HOSPITAL_COMMUNITY): Payer: Self-pay

## 2024-04-09 ENCOUNTER — Other Ambulatory Visit (HOSPITAL_COMMUNITY): Payer: Self-pay

## 2024-04-10 ENCOUNTER — Other Ambulatory Visit (HOSPITAL_COMMUNITY): Payer: Self-pay

## 2024-05-19 ENCOUNTER — Other Ambulatory Visit (HOSPITAL_COMMUNITY): Payer: Self-pay

## 2024-05-19 MED ORDER — AMLODIPINE BESYLATE 10 MG PO TABS
10.0000 mg | ORAL_TABLET | Freq: Every day | ORAL | 1 refills | Status: AC
  Filled 2024-05-19: qty 90, 90d supply, fill #0
  Filled 2024-09-17: qty 90, 90d supply, fill #1

## 2024-05-19 MED ORDER — ASPIRIN 81 MG PO TBEC
81.0000 mg | DELAYED_RELEASE_TABLET | Freq: Every day | ORAL | 0 refills | Status: DC
  Filled 2024-05-19: qty 90, 90d supply, fill #0

## 2024-06-24 ENCOUNTER — Other Ambulatory Visit (HOSPITAL_COMMUNITY): Payer: Self-pay

## 2024-06-24 MED ORDER — LEVOTHYROXINE SODIUM 25 MCG PO TABS
25.0000 ug | ORAL_TABLET | Freq: Every morning | ORAL | 3 refills | Status: AC
  Filled 2024-06-24: qty 90, 90d supply, fill #0
  Filled 2024-10-01: qty 90, 90d supply, fill #1

## 2024-07-08 ENCOUNTER — Other Ambulatory Visit: Payer: Self-pay

## 2024-07-27 ENCOUNTER — Other Ambulatory Visit (HOSPITAL_COMMUNITY): Payer: Self-pay

## 2024-07-27 MED ORDER — OMEPRAZOLE 40 MG PO CPDR
DELAYED_RELEASE_CAPSULE | ORAL | 0 refills | Status: DC
  Filled 2024-07-27: qty 30, 30d supply, fill #0

## 2024-07-28 DIAGNOSIS — K219 Gastro-esophageal reflux disease without esophagitis: Secondary | ICD-10-CM | POA: Diagnosis not present

## 2024-08-17 ENCOUNTER — Other Ambulatory Visit (HOSPITAL_COMMUNITY): Payer: Self-pay

## 2024-08-17 DIAGNOSIS — R1319 Other dysphagia: Secondary | ICD-10-CM | POA: Diagnosis not present

## 2024-08-17 DIAGNOSIS — F1090 Alcohol use, unspecified, uncomplicated: Secondary | ICD-10-CM | POA: Diagnosis not present

## 2024-08-17 DIAGNOSIS — I10 Essential (primary) hypertension: Secondary | ICD-10-CM | POA: Diagnosis not present

## 2024-08-17 MED ORDER — ASPIRIN 81 MG PO TBEC
81.0000 mg | DELAYED_RELEASE_TABLET | Freq: Every day | ORAL | 3 refills | Status: AC
  Filled 2024-08-17: qty 90, 90d supply, fill #0

## 2024-08-17 MED ORDER — HYDROCHLOROTHIAZIDE 25 MG PO TABS
25.0000 mg | ORAL_TABLET | Freq: Every day | ORAL | 3 refills | Status: AC
  Filled 2024-08-17: qty 90, 90d supply, fill #0

## 2024-09-10 ENCOUNTER — Other Ambulatory Visit (HOSPITAL_COMMUNITY): Payer: Self-pay

## 2024-09-10 MED ORDER — OMEPRAZOLE 40 MG PO CPDR
40.0000 mg | DELAYED_RELEASE_CAPSULE | Freq: Every morning | ORAL | 0 refills | Status: AC
  Filled 2024-09-10: qty 30, 30d supply, fill #0

## 2024-09-17 ENCOUNTER — Other Ambulatory Visit (HOSPITAL_COMMUNITY): Payer: Self-pay

## 2024-10-02 ENCOUNTER — Other Ambulatory Visit (HOSPITAL_COMMUNITY): Payer: Self-pay
